# Patient Record
Sex: Male | Born: 1950 | Race: White | Hispanic: No | Marital: Married | Smoking: Current every day smoker
Health system: Southern US, Community
[De-identification: ages and names within clinical notes are randomized; demographics above are authoritative.]

## PROBLEM LIST (undated history)

## (undated) DIAGNOSIS — I509 Heart failure, unspecified: Secondary | ICD-10-CM

## (undated) DIAGNOSIS — E119 Type 2 diabetes mellitus without complications: Secondary | ICD-10-CM

## (undated) DIAGNOSIS — I712 Thoracic aortic aneurysm, without rupture: Secondary | ICD-10-CM

---

## 1997-08-04 ENCOUNTER — Ambulatory Visit (HOSPITAL_COMMUNITY): Admission: RE | Admit: 1997-08-04 | Discharge: 1997-08-04 | Payer: Self-pay | Admitting: *Deleted

## 1997-08-31 ENCOUNTER — Inpatient Hospital Stay (HOSPITAL_COMMUNITY): Admission: RE | Admit: 1997-08-31 | Discharge: 1997-09-01 | Payer: Self-pay | Admitting: *Deleted

## 1998-05-19 ENCOUNTER — Ambulatory Visit (HOSPITAL_COMMUNITY): Admission: RE | Admit: 1998-05-19 | Discharge: 1998-05-19 | Payer: Self-pay | Admitting: *Deleted

## 1998-08-10 ENCOUNTER — Emergency Department (HOSPITAL_COMMUNITY): Admission: EM | Admit: 1998-08-10 | Discharge: 1998-08-10 | Payer: Self-pay | Admitting: Emergency Medicine

## 1998-08-10 ENCOUNTER — Encounter: Payer: Self-pay | Admitting: Emergency Medicine

## 2001-04-16 ENCOUNTER — Ambulatory Visit (HOSPITAL_BASED_OUTPATIENT_CLINIC_OR_DEPARTMENT_OTHER): Admission: RE | Admit: 2001-04-16 | Discharge: 2001-04-16 | Payer: Self-pay | Admitting: Internal Medicine

## 2003-09-25 ENCOUNTER — Emergency Department (HOSPITAL_COMMUNITY): Admission: EM | Admit: 2003-09-25 | Discharge: 2003-09-25 | Payer: Self-pay | Admitting: *Deleted

## 2004-05-02 ENCOUNTER — Ambulatory Visit (HOSPITAL_COMMUNITY): Admission: RE | Admit: 2004-05-02 | Discharge: 2004-05-02 | Payer: Self-pay | Admitting: Neurosurgery

## 2005-01-09 ENCOUNTER — Ambulatory Visit (HOSPITAL_COMMUNITY): Admission: RE | Admit: 2005-01-09 | Discharge: 2005-01-09 | Payer: Self-pay | Admitting: Neurosurgery

## 2005-02-23 ENCOUNTER — Inpatient Hospital Stay (HOSPITAL_COMMUNITY): Admission: RE | Admit: 2005-02-23 | Discharge: 2005-02-28 | Payer: Self-pay | Admitting: Neurosurgery

## 2005-03-02 ENCOUNTER — Ambulatory Visit: Payer: Self-pay | Admitting: Cardiology

## 2005-03-03 ENCOUNTER — Inpatient Hospital Stay (HOSPITAL_COMMUNITY): Admission: EM | Admit: 2005-03-03 | Discharge: 2005-03-06 | Payer: Self-pay | Admitting: Emergency Medicine

## 2005-03-26 ENCOUNTER — Ambulatory Visit: Payer: Self-pay | Admitting: Cardiology

## 2005-04-12 ENCOUNTER — Ambulatory Visit: Payer: Self-pay | Admitting: Cardiology

## 2005-05-07 ENCOUNTER — Ambulatory Visit: Payer: Self-pay | Admitting: Cardiology

## 2005-06-18 ENCOUNTER — Ambulatory Visit: Payer: Self-pay | Admitting: Cardiology

## 2005-06-18 ENCOUNTER — Ambulatory Visit: Payer: Self-pay | Admitting: Internal Medicine

## 2005-06-22 ENCOUNTER — Ambulatory Visit: Payer: Self-pay | Admitting: Cardiology

## 2005-09-04 ENCOUNTER — Ambulatory Visit: Payer: Self-pay | Admitting: *Deleted

## 2006-03-06 IMAGING — CR DG CHEST 2V
2 series · 2 of 2 positions shown · non-contrast
Comparison: 05/02/04

CLINICAL DATA: pre-admit for lumbar fusion
 OBJNN-5 VIEWS:

[view not recorded (1 of 2)]
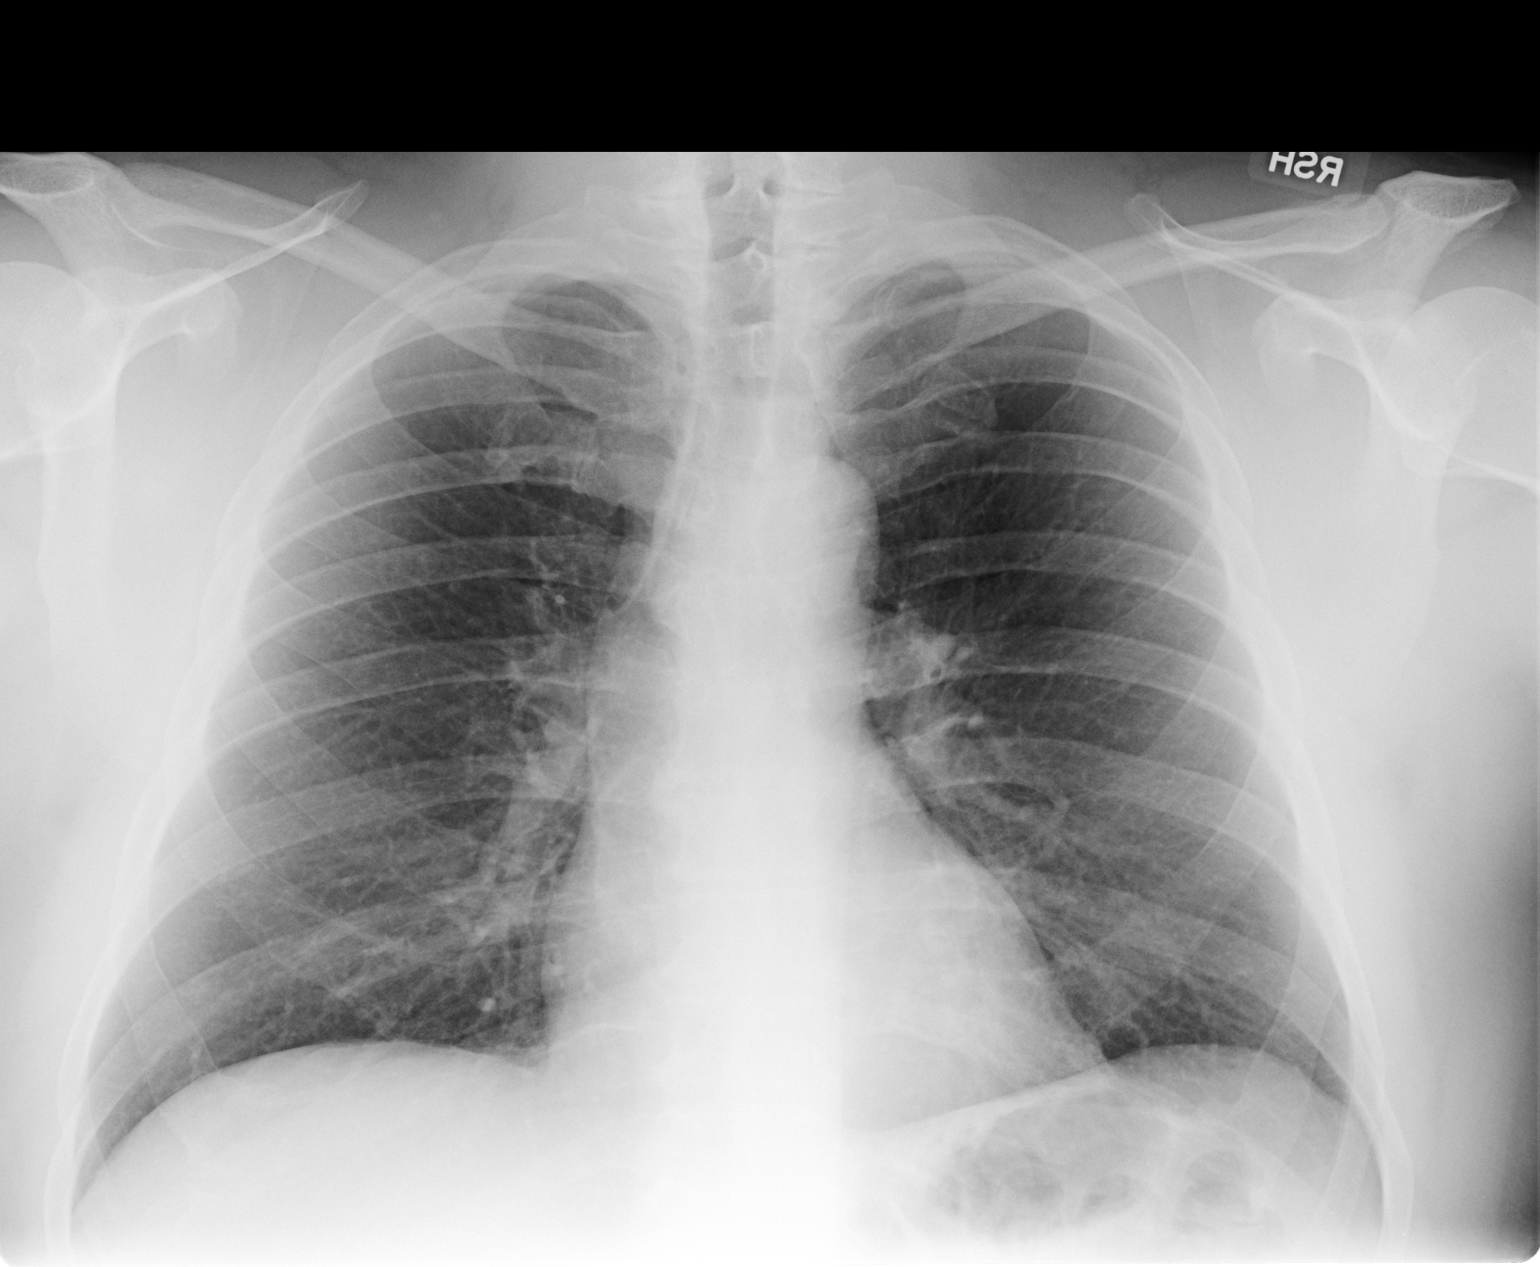

[view not recorded (2 of 2)]
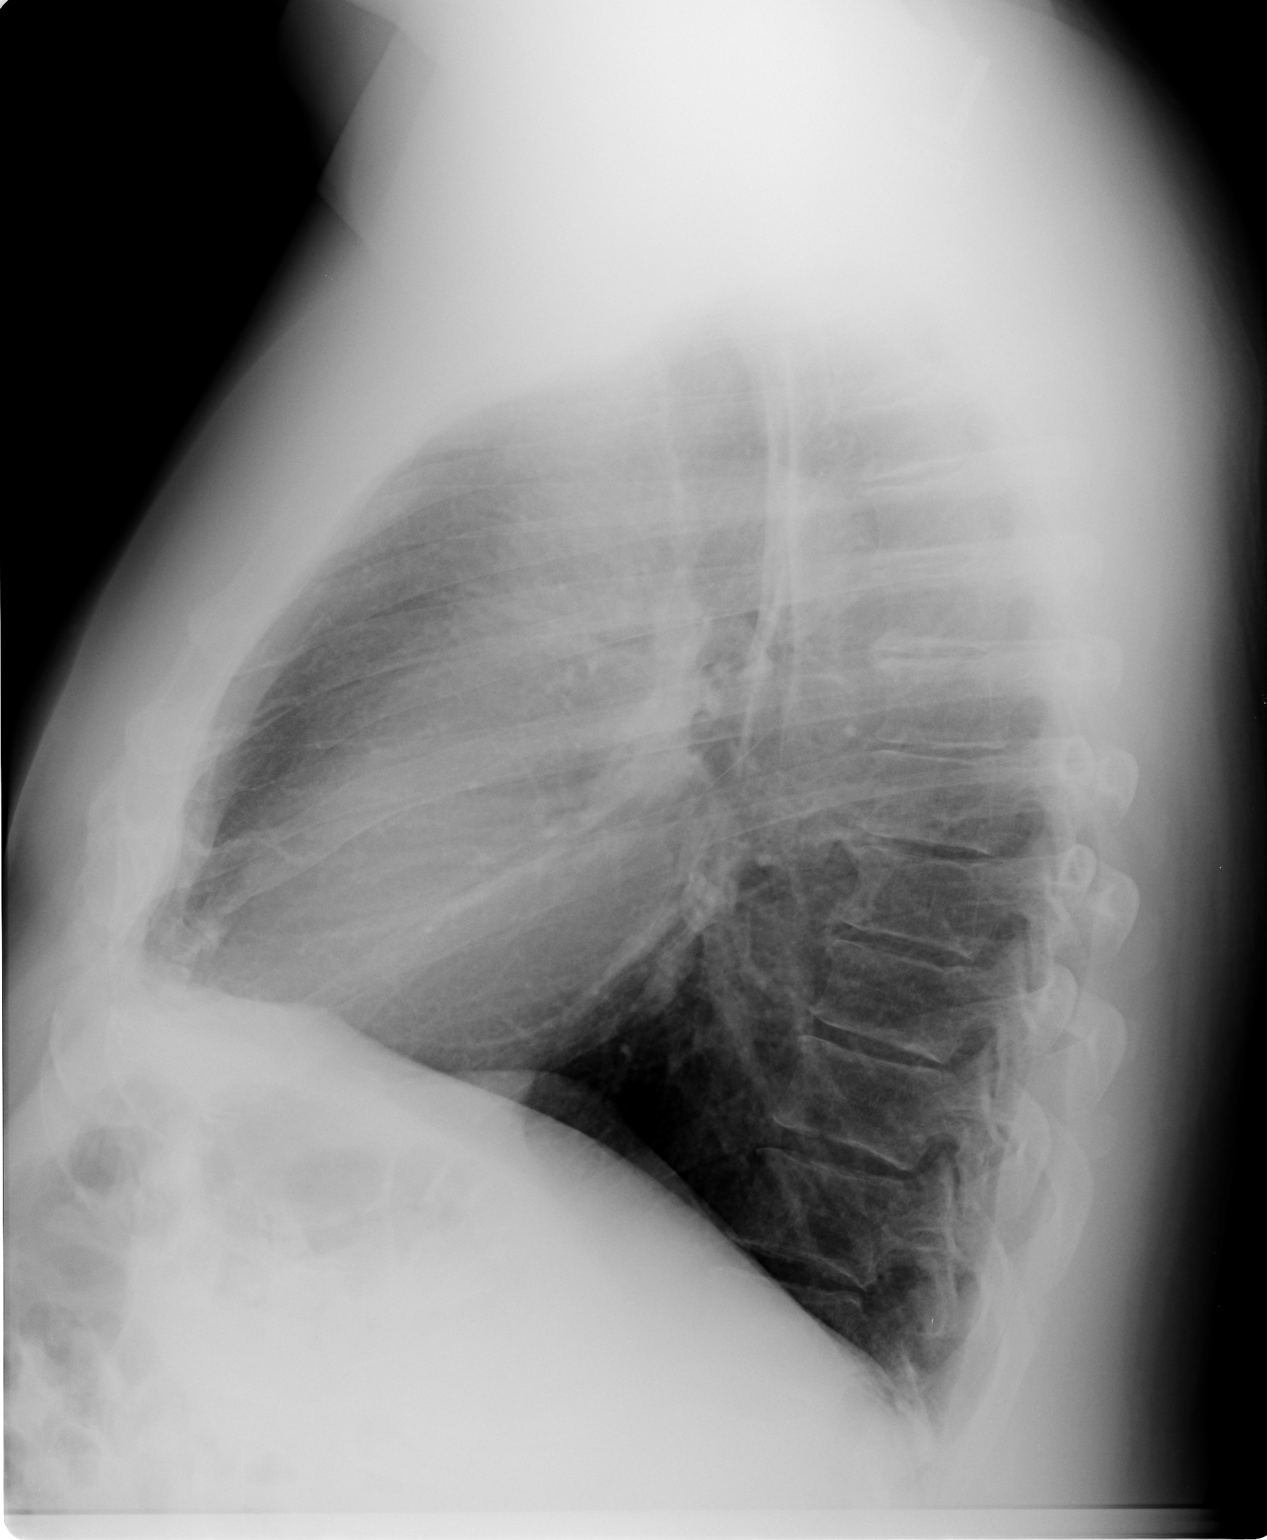

[2 of 2 positions shown; findings below may reference images not displayed]

FINDINGS: Midline trachea.  Heart size normal.  Mediastinal contours unremarkable.  Bronchial wall thickening again identified.  No focal lung opacities.  No pleural effusion.  Osseous structures are intact.
IMPRESSION: No acute cardiopulmonary disease.  Stable mild bronchial wall thickening.

## 2007-07-11 ENCOUNTER — Ambulatory Visit: Payer: Self-pay | Admitting: Cardiology

## 2007-10-10 ENCOUNTER — Ambulatory Visit (HOSPITAL_COMMUNITY): Admission: RE | Admit: 2007-10-10 | Discharge: 2007-10-10 | Payer: Self-pay | Admitting: Neurosurgery

## 2010-07-11 NOTE — Op Note (Signed)
NAMESHOJI, PERTUIT               ACCOUNT NO.:  [id number]   MEDICAL RECORD NO.:  [id number]          PATIENT TYPE:  AMB   LOCATION:  SDS                          FACILITY:  [facility]   PHYSICIAN:  [name], [name]      DATE OF BIRTH:  01/04/1951   DATE OF PROCEDURE:  10/10/2007  DATE OF DISCHARGE:  10/10/2007                               OPERATIVE REPORT   PREOPERATIVE DIAGNOSIS:  Left carpal tunnel syndrome.   POSTOPERATIVE DIAGNOSIS:  Left carpal tunnel syndrome.   OPERATIVE PROCEDURE:  Left carpal tunnel release.   SURGEON:  [name], MD   ANESTHESIA:  General endotracheal.   PREPARATION:  Prepped and draped with alcohol wipe.   COMPLICATIONS:  None.   NURSE ASSISTANT:  Covington.   BODY OF TEXT:  A 60 year old gentleman with left carpal tunnel syndrome,  taken to the operating room, smoothly anesthetized and intubated, placed  prone on the operating table.  Following shave, prep, and drape in the  usual sterile fashion, the skin was infiltrated with 1% lidocaine.  The  skin incision was on the line between the third and fourth raise of the  hand extending a centimeter and half to the distal wrist crease.  Palmar  fat was dissected.  The volar aspect of the transverse carpal ligament  was identified and divided down toward the nerve where it was visible.  Scissors were then used to divide the tranverse carpal ligament up on  the wrist and down under the palm of the hand.  A complete decompression  obtained.  The patient was neurologically intact.  The wound was  irrigated in a single layer of 3-0 nylon and an interrupted vertical  mattress fashion was used to close.  Betadine and Telfa dressing was  applied with a fluffy wrap.  The patient returned to recovery room in  good condition.           ______________________________  [name], [name]     MWR/MEDQ  D:  10/10/2007  T:  10/10/2007  Job:  [id number]

## 2010-07-11 NOTE — Assessment & Plan Note (Signed)
[facility]                            CARDIOLOGY OFFICE NOTE   Scott Riddle, Scott Riddle                      MRN:          [id number]  DATE:07/11/2007                            DOB:          Aug 26, 1950    Scott Riddle returns to follow up with [place] after a two year absence from  the practice.   PAST MEDICAL HISTORY:  1. He had an ST segment elevation MI in January of 2007.  He had a      Taxus stent to the mid right coronary artery and proximal LAD.  He      a residual of 67% in the mid circumflex.  His ejection fraction was      55% by ventriculography.  He was in the TRITON study, but developed      a rash from Plavix.  He has been on Ticlid ever since.  He is being      followed at the [place] where he gets frequent blood work.  His CBCs      have been within normal limits as I have reviewed them today.  2. Severe mixed hyperlipidemia.  He had triglycerides over 1200, but      these have been pushed down to about 350 on a combination of fish      oil, Tricor and Crestor.  3. Hypertension.  4. History of small infrarenal abdominal aortic aneurysm.  5. History of tachy palpitations.   He has been having some stabbing, sharp chest pain that is intermittent.  He has been taking nitroglycerin for this.  He denies any true angina or  ischemic symptoms.  At times at night when he is lying down, he has some  palpitations.  They resolve as soon as he sits up.  He has had no  syncope or presyncope.   CURRENT MEDICATIONS:  1. Aspirin 325 mg daily.  2. Lyrica 75 mg p.o. b.i.d.  3. Wellbutrin 300 mg daily.  4. Tricor 145 mg nightly.  5. Rosuvastatin 10 mg daily.  6. Niacin 500 mg twice daily.  7. Lisinopril 10 mg daily.  8. Metoprolol 25 b.i.d.  9. Ticlid 250 mg p.o. b.i.d.  10.C Omegas 3 tablets t.i.d.  11.Nicotinell 10 mg b.i.d.  12.Morphine 30 mg, 1 in the morning, 2 in the evening.  13.Temazepam 30 mg daily.   He was admitted to the [facility]  with chest pain last year.  He  had a stress nuclear study, which showed overall normal left ventricular  function, no specific EF given.  He had no obvious ischemia.   EXAMINATION:  His blood pressure is 120/80, pulse 59 and regular.  His  weight is 283.  He walks with a cane because of severe back problems  with left lower extremity atrophy.  He is very pleasant.  Alert and  oriented x3.  HEENT:  Sclerae slightly injected.  He is bearded.  Otherwise negative.  Carotid upstrokes are equal bilaterally without bruits.  No JVD.  Thyroid is not enlarged.  Trachea is midline.  LUNGS:  Clear.  HEART:  Reveals a  nondisplaced PMI.  He has a normal S1, S2.  ABDOMEN:  Soft with good bowel sounds.  EXTREMITIES: Reveal severe atrophy of the left lower extremity.  Pulses  are intact.  There is no sign of DVT.  NEURO:  Exam is grossly intact except for his gait.   His EKG is essentially normal except for some sinus bradycardia.   I have had a long talk with Brit and his wife today.  I have  recommended that he come off the Ticlid since there is really no data  this far out from drug-eluting stents.  It is also a potentially toxic  drug in terms of bleeding and also bone marrow suppression.   In addition, I have strongly recommended that he get an ultrasound of  his abdomen to follow up on his abdominal aortic aneurysm.  He would  like to get this through the [place].  He told me he will mention this to  them on his next visit.   I have made no changes in his medical program.  I think he is on a good  program otherwise.  We will plan on seeing him back in a year or p.r.n.     Thomas C. Daleen Squibb, MD, [facility]  Electronically Signed    TCW/MedQ  DD: 07/11/2007  DT: 07/11/2007  Job #: [id number]   cc:   [facility]

## 2010-07-14 NOTE — H&P (Signed)
Scott Riddle, Scott Riddle NO.:  [id number]   MEDICAL RECORD NO.:  [id number]          PATIENT TYPE:  INP   LOCATION:  2005                         FACILITY:  [facility]   PHYSICIAN:  Jesse Sans. Riddle, [name]   DATE OF BIRTH:  04/14/1950   DATE OF ADMISSION:  03/02/2005  DATE OF DISCHARGE:                                HISTORY & PHYSICAL   CHIEF COMPLAINT:  Chest pressure with striations across my shoulders and to  both arms with exertion.   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 Trey Sailors.   He has been up and about and doing well up until Thursday, March 01, 2005,  when while up on his crutches began to have this upper chest discomfort  going into both arms. It lasted for about an hour. He said he thought he  would die.   This recurred again yesterday and lasted for about half an hour.   He has no previous cardiac history. He does have risk factors of age, sex,  history of hyperlipidemia that is not treated, obstructive sleep apnea and  obesity. He also smoked heavily for a number of years but quit about a year  ago.   PAST MEDICAL HISTORY:  HE IS INTOLERANT OF NEURONTIN.   CURRENT MEDICATIONS:  1.  Wellbutrin 300 mg daily.  2.  Percocet 2-3 q.6h p.r.n. pain.  3.  Lyrica 75 mg p.o. b.i.d.  4.  Ciprofloxacin 500 mg p.o. b.i.d. x3 more days postoperatively.  5.  Aspirin daily.   OTHER HISTORY:  Is unremarkable other than his chronic back problems with  atrophy of his left lower extremity.   SOCIAL HISTORY:  He is married and lives in [place]. He has two  children, both teenagers. He works here at [employer].   FAMILY HISTORY:  Unremarkable for premature coronary disease.   REVIEW OF SYSTEMS:  Negative other than the HPI. There is no bleeding  history.   PHYSICAL EXAMINATION:  GENERAL:  Tonight he is in no acute distress.  He is  very pleasant.  VITAL SIGNS:  Blood pressure is 115/66, pulse is 72 and he is in sinus  rhythm. Respiratory rate is 18. He is afebrile. His percent saturation is  96%.  HEENT:  He is bearded. Dentition is fair. Extraocular movements are intact.  Pupils are equal, round and reactive to light, sclerae clear. Facial  symmetry is normal. Carotid upstrokes are equal bilaterally without bruits.  There is no JVD. Thyroid is not enlarged.  LUNGS:  Clear. He has a large plastic brace around his chest and back from  his surgery.  ABDOMEN:  Exam is limited but bowel sounds are present and it is  nondistended.  EXTREMITIES:  Reveal no clubbing, cyanosis or edema. Pulses are brisk.  NEURO EXAM:  Intact except for atrophy in his left leg and weakness.   Chest x-ray shows no acute cardiopulmonary disease. Chest CT was obtained  which  showed left lower lobe atelectasis but no PE.   EKG shows ST segment depression of a scooping type in 2, 3 and aVF. This  is new since December several years ago.   His cardiac point of care markers are positive at 0.35 and 0.37. CK-MB is  negative thus far. His other laboratory data is unremarkable. Other lab data  is remarkable for a hemoglobin of 9.9.   ASSESSMENT AND PLAN:  1.  Symptoms consistent with exertional angina with prolonged pain on      Thursday and Friday. He has significant risk factors for cardiac disease      and ST segment changes on his EKG with positive troponin's. Will assume      this is a non ST segment elevation myocardial infarction.  2.  Obstructive sleep apnea.  3.  Hyperlipidemia untreated.  4.  Obesity.  5.  History of recent lumbar fusion a week ago with Dr. Trey Sailors.  6.  History of heavy tobacco use which he quit last year.   PLAN:  1.  Admit to telemetry.  2.  Check serial markers.  3.  Intravenous nitroglycerin.  4.  I have a call in the Dr. Newell Coral who is covering for Dr. Channing Mutters to see if      we can use IV heparin.   5.  Continue other medications.  6.  Check fasting lipids and begin Statin at appropriate dose.  7.  Cardiac catheterization on Monday.   Indications of risks and potential benefits have been discussed with the  patient and his wife and they agree to proceed.      Scott Riddle, [name]  Electronically Signed     TCW/MEDQ  D:  03/03/2005  T:  03/03/2005  Job:  [id number]   cc:   [name], [name]  Fax: [phone]   [name]  Fax: [phone]

## 2010-07-14 NOTE — Cardiovascular Report (Signed)
[name], [name] NO.:  [id number]   MEDICAL RECORD NO.:  [id number]          PATIENT TYPE:  INP   LOCATION:  6523                         FACILITY:  [facility]   PHYSICIAN:  Charlies Constable, [name] [facility] DATE OF BIRTH:  May 29, 1950   DATE OF PROCEDURE:  03/05/2005  DATE OF DISCHARGE:                              CARDIAC CATHETERIZATION   CLINICAL HISTORY:  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  He had back surgery about seven to 10 days  ago with two-level fusion.  Following discharge he developed chest pain, was  admitted to the hospital with more severe chest pain and positive troponins  consistent with a non-ST elevation infarction.  Dr. Eden Emms studied him  earlier today and found tight lesions in the mid right and proximal LAD and  moderate lesion in the circumflex.  We elected to treat the LAD and right  lesions today.   PROCEDURE:  The procedure was performed via the right femoral artery.  We  used a 6-French JR4 guiding catheter with side holes for the right coronary  intervention.  Patient was given Angiomax bolus and infusion.  He was  enrolled in the TRITAN drug and was randomized either to prasugrel versus  Ticlid.  We passed a Prowater wire across the lesion in the mid right  coronary artery into the distal vessel without difficulty.  We pre dilated  with a 3.25 x 50 mm Maverick performing one inflations up to 8 atmospheres  for 30 seconds.  We then deployed a 3.5 x 20 mm TAXUS stent deploying this  with one inflation up to 14 atmospheres for 30 seconds.  We post dilated  with a 3.75 x 15 mm Quantum Maverick performing one inflation up to 16  atmospheres for 30 seconds.  Final diagnostic studies were then performed  through the guiding catheter.   We then preformed then approached the left anterior descending artery  lesion.  This was located in the proximal LAD but there was some disease  that extended  across the beyond a diagonal branch.  The ostium of the  diagonal branch was free of major obstruction.  We passed a Prowater wire  across the lesion into the distal LAD without difficulty.  We pre dilated  with a 2.5 x 15 mm Maverick performing one inflation up to 6 atmospheres for  30 seconds.  We then deployed a 2.75 x 20 mm TAXUS stent crossing the  diagonal branch.  We deployed this with one inflation up to 12 atmospheres  for 30 seconds.  We post dilated with a 3 x 15 mm Quantum Maverick  performing two inflations up to 15 atmospheres for 30 seconds.  Final  diagnostic studies were then performed through the guiding catheter.  Patient tolerated procedure well and left the laboratory in satisfactory  condition.   RESULTS:  Initially the stenosis in the mid right coronary artery was  estimated at 95%.  Following stenting this improved to 0%.   The lesion in the left anterior descending artery was  initially estimated at  95%.  Following stenting this improved to 0%.   CONCLUSION:  1.  Successful PCI of the lesion in the mid right coronary artery using a      TAXUS drug-eluting stent with improvement in the center of narrowing      from 95% to 0%.  2.  Successful PCI of the lesion in the proximal left anterior descending      using a TAXUS drug-eluting stent with improvement in the center of      narrowing from 95% to 0%.   DISPOSITION:  Patient returned to postanesthesia care unit for further  observation.  Will plan evaluation of the circumflex lesion at a later time  probably with an exercise Myoview scan to decide if any further  revascularization is needed.           ______________________________  Charlies Constable, [name] [facility]     BB/MEDQ  D:  03/05/2005  T:  03/05/2005  Job:  [id number]   cc:   [name]  Fax: [phone]   [name], [name]  Fax: [phone]   [name], [name]  [address]  [address]  [place]  [place] [address]   [name], [name]  [address]  [address]  [place]  [place] [address]   [facility]

## 2010-07-14 NOTE — Op Note (Signed)
NAMEGUALBERTO, WAHLEN NO.:  [id number]   MEDICAL RECORD NO.:  [id number]          PATIENT TYPE:  OIB   LOCATION:  2899                         FACILITY:  [facility]   PHYSICIAN:  [name], [name]      DATE OF BIRTH:  09-07-1950   DATE OF PROCEDURE:  05/02/2004  DATE OF DISCHARGE:                                 OPERATIVE REPORT   PREOPERATIVE DIAGNOSIS:  Right carpal tunnel syndrome.   POSTOPERATIVE DIAGNOSIS:  Right carpal tunnel syndrome.   OPERATION PERFORMED:  Right carpal tunnel release.   SURGEON:  [name], [name]   ANESTHESIA:  1% local lidocaine with MAC.   COMPLICATIONS:  None.   ASSISTANT:  Covington.   DESCRIPTION OF PROCEDURE:  60 year old gentleman with right carpal tunnel  syndrome.  Taken to the operating room, smoothly anesthetized and intubated,  placed prone on the operating table.  Following shave, prep in the usual  sterile fashion, 1% local lidocaine was injected along the line between the  third and fourth rays 1 cm distal to the distal wrist crease.  Palm was  incised down to the palmar fat.  The transverse carpal ligament was  identified and opened. The median nerve was immediately identified, the  carpal tunnel explored and found to be open.  Scissors were used to divide  the ligament in its volar portion up to its most proximal extent and then to  its most distal extent.  The nerve was completely freed.  Hand function was  normal.  The wound was irrigated and hemostasis was assured.  Closed with a  single layer of 3-0 nylon in an interrupted vertical mattress fashion.  Betadine Telfa dressing and standard hand wrap was applied.      MWR/MEDQ  D:  05/02/2004  T:  05/02/2004  Job:  [id number]

## 2010-07-14 NOTE — H&P (Signed)
Scott Riddle, Scott Riddle NO.:  [id number]   MEDICAL RECORD NO.:  [id number]          PATIENT TYPE:  INP   LOCATION:  2860                         FACILITY:  [facility]   PHYSICIAN:  [name], [name]      DATE OF BIRTH:  11/13/1950   DATE OF ADMISSION:  02/23/2005  DATE OF DISCHARGE:                                HISTORY & PHYSICAL   ADMITTING DIAGNOSES:  Spondylosis L4-5 and L5-S1.   This is a 60 year old right-handed white gentleman who I had known for  numerous years.  He is a former patient of Dr. Danella Penton.  Has had pain in  his back, down his lower extremities.  He had diskectomy in 1981.  This was  at 4-5 and at 5-1.  He had been in and out of the pain clinic, off and on  methadone.  He is currently off, but has pain in his back, atrophy of the  left calf, and a dorsiflexor weakness 4/5 on the left and extensive  compression at 4-5 and 5-1.  Is admitted now for decompression and fusion at  those levels.   PAST MEDICAL HISTORY:  Depression.  He has been on Wellbutrin for that.  He  has no allergies.  Does smoke a pack of cigarettes a day.  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   FAMILY HISTORY:  Mom is 17, in good health.  Daddy is 75, in reasonably good  health.   REVIEW OF SYSTEMS:  Remarkable for back and leg pain.   PHYSICAL EXAMINATION:  HEENT:  Within normal limits.  He has reasonable  range of motion of neck.  CHEST:  Clear.  CARDIAC:  Regular rate and rhythm.  ABDOMEN:  Nontender with no hepatosplenomegaly.  EXTREMITIES:  Without clubbing, cyanosis, edema.  GENITOURINARY:  Deferred.  PERIPHERAL PULSES:  Good.  NEUROLOGIC:  He is awake, alert, and oriented.  Cranial nerves are intact.  Motor examination shows 5/5 strength throughout the upper extremity and  lower extremities.  In the left leg he has atrophy of the left calf.  Dorsiflexor weakness 4/5 on the left.  In the knee extensors and hip flexors  he had severe pain limitation.   MR demonstrates  spondylosis at 4-5 and 5-1.  On the left at 5-1 he has  compression at 5 root laterally as well as where it traverses disk space.  At 4-5 he has severe spondylytic change.   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   PLAN:  Lumbar laminectomy, diskectomy, posterior lumbar interbody fusion,  interbody fusion cages and pedicle screw fixation L4-5 and L5-S1.  The risks  and benefits of this approach have been discussed with him and he wished to  proceed.           ______________________________  [name], [name]     MWR/MEDQ  D:  02/23/2005  T:  02/23/2005  Job:  [id number]

## 2010-07-14 NOTE — Discharge Summary (Signed)
NAMENona Dell NO.:  [id number]   MEDICAL RECORD NO.:  [id number]            PATIENT TYPE:   LOCATION:                                 FACILITY:   PHYSICIAN:  [name], [name]           DATE OF BIRTH:   DATE OF ADMISSION:  02/23/2005  DATE OF DISCHARGE:  02/28/2005                                 DISCHARGE SUMMARY   ADMISSION DIAGNOSIS:  Spondylosis, L5-S1.   DISCHARGE DIAGNOSES:  Spondylosis, L5-S1.   PROCEDURE:  L5-S1 laminectomy and diskectomy.  __________ cages.  Segmental  _________ and screw fixation from L4 to S1.  Posterolateral arthrodesis L4-  S1.   COMPLICATIONS:  None.  __________.   HISTORY OF PRESENT ILLNESS:  A 60 year old gentleman with a history and  physical is recanted on the chart.  He has had diskectomies at L4-5 and 5-1  numerous years ago.  He has developed pain and atrophy in the left leg.   PHYSICAL EXAMINATION:  GENERAL:  Intact.  NEUROLOGIC:  Exam showed left L4-5 radiculopathy.   HOSPITAL COURSE:  He was admitted after ascertaining normal lab chart  values. He underwent fusion at 4-5 and 5-1.  Postoperatively he has done  reasonably well.  His left pain is alleviated.  He has intermittent pain in  his right leg.  Strength is full save for the dorsiflexors on the left which  are at baseline weak.  Had some drainage from the incision which is now dry.   DISCHARGE MEDICATIONS:  He is being discharged home on Lyrica, Percocet,  ciprofloxacin and Wellbutrin.   FOLLOW UP:  His followup will be [facility] office in  about one week for suture removal.           ______________________________  [name], [name]     MWR/MEDQ  D:  02/28/2005  T:  02/28/2005  Job:  [id number]

## 2010-07-14 NOTE — Op Note (Signed)
[name], Riddle NO.:  [id number]   MEDICAL RECORD NO.:  [id number]          PATIENT TYPE:  INP   LOCATION:  3005                         FACILITY[facility]  [facility]   PHYSICIAN:  [name], [name]      DATE OF BIRTH:  [birth year]   DATE OF PROCEDURE:  02/23/2005  DATE OF DISCHARGE:                                 OPERATIVE REPORT   PREOPERATIVE DIAGNOSIS:  Spondylosis, L4-5, L5-S1.   POSTOPERATIVE DIAGNOSIS:  Spondylosis, L4-5, L5-S1.   PROCEDURE:  L4-5 and L5-S1 laminectomy, diskectomy, posterior lumbar  interbody fusion with Ray threaded fusion cages, segment of pedicle screw  fixation from L4 to S1, and posterolateral arthrodesis from L4 to S1.   SURGEON:  [name], MD.   ANESTHESIA:  General endotracheal.   PREP:  Prepped with Betadine and prepped and scrubbed with alcohol wipe.   COMPLICATIONS:  None.   NURSE ASSISTANT:  Covington.   PHYSICIAN ASSISTANT:  Clydene Fake, MD.   BODY OF TEXT:  This is a 60 year old gentleman with severe lumbar  spondylosis, atrophy of the left leg.  Taken to the operating room, smoothly  anesthetized and intubated, placed prone on the operating table.  Following  shave, prep, and drape in the usual sterile fashion, the skin was  infiltrated with 1% Lidocaine with 1:400,000 epinephrine.  This skin was  incised from mid L3 to mid S1.  The lamina of L4 and L5, S1, and the sacral  ala and the transverse processes of L4 and L5 were exposed bilaterally on a  subperiosteal plane.  Intraoperative x-ray confirmed correctness level had  been confirmed correctness level.  The pars interarticularis, remaining  lamina, and inferior facet of L4 and L5, the superior facet of L5 and S1  were removed bilaterally using the high-speed drill.  On the left side,  there was severe facet arthropathy and significant compression of both the 4  and 5 roots as they traversed the foraminal areas.  This was completely  relieved by removing the  facet joints and removing the ligamentum flavum.  The right side presented with relatively normal anatomy.  Following complete  decompression of the 4, 5, and 1 roots, it was decided not to place Ray  cages on the left side because of the dense scarring and the concern with  injuring the root.  Cages were then placed on the right side, 12 x 26 mm.  They were packed with bone graft harvested from the facet joints.  Pedicle  screws were then placed at L4, L5, and S1 bilaterally, and intraoperative x-  ray showed good placement of screws.  The screws were connected by a rod,  and the locking caps tightened.  The transverse process and the sacral ala  were decorticated with a high-speed drill, and BMP on the extender matrix  was placed over them.  Intraoperative films showed good placement of cages,  screws, and rods.  The subcutaneous tissue and fascia were reapproximated  with 2-0 Vicryl in an interrupted fashion, the subcuticular tissues were  reapproximated with 3-0 Vicryl in an interrupted  fashion, and the skin was  closed with 3-0 nylon in a running lock fashion.  A Betadine and Telfa  dressing was applied and made occlusive with OpSite, and the patient  returned to the recovery room in good condition.           ______________________________  [name], [name]     MWR/MEDQ  D:  02/23/2005  T:  02/25/2005  Job:  [id number]

## 2010-07-14 NOTE — Cardiovascular Report (Signed)
NAMEDRESHAWN, HENDERSHOTT NO.:  [id number]   MEDICAL RECORD NO.:  [id number]          PATIENT TYPE:  INP   LOCATION:  2399                         FACILITY:  [facility]   PHYSICIAN:  [name], [name]     DATE OF BIRTH:  [birth year]   DATE OF PROCEDURE:  03/05/2005  DATE OF DISCHARGE:                              CARDIAC CATHETERIZATION   PROCEDURE:  Catheterization.   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  He was having  classic angina and bumped his troponins.  Catheterization was done to rule  out coronary disease.  Note should be made that the patient had lumbar back  surgery by Dr. Channing Mutters about 10 days ago.  I spoke with Dr. Channing Mutters and he said that  using full-dose anticoagulants was fine.   Cine catheterization was done from right femoral artery with a 6-French  catheter.   The left main coronary was normal.   The left anterior descending artery had 80% disease just before the takeoff  of the second diagonal branch.  The distal vessel was normal.   The circumflex coronary was nondominant.  There was long, tubular 60-70%  midvessel disease.  This included the takeoff of the first obtuse marginal  branch.   Right coronary artery appeared to be the culprit vessel.  There are 90%  multiple lesions that were eccentric and with possible thrombus in the  midvessel.  Flow was normal.  PDA and PLA were normal.   RAO ventriculography:  RAO ventriculography showed minimal inferior basal  wall hypokinesis.  EF was preserved to 55%.  There is no gradient across  aortic valve and no MR.  Aortic pressure was 130/15, LV pressure was 130/15.  Aortic pressure was 130/84.   The patient had a small infrarenal AAA by distal aortography.   IMPRESSION:  Films will be reviewed with Dr. Juanda Chance.  I suspect he will want  to do two-vessel angioplasty.  However, the patient would be a candidate for  bypass surgery.   I believe the right coronary artery is probably  the culprit lesion.           ______________________________  [name], [name]     PN/MEDQ  D:  03/05/2005  T:  03/05/2005  Job:  [id number]   cc:   [name], [name]  [address]  [address]  [place]  [place] [address]

## 2010-07-14 NOTE — Discharge Summary (Signed)
NAMERAEL, YO NO.:  [id number]   MEDICAL RECORD NO.:  [id number]          PATIENT TYPE:  INP   LOCATION:  6523                         FACILITY:  [facility]   PHYSICIAN:  Charlies Constable, [name] [facility] DATE OF BIRTH:  25-Aug-1950   DATE OF ADMISSION:  03/02/2005  DATE OF DISCHARGE:  03/06/2005                                 DISCHARGE SUMMARY   PRIMARY CARDIOLOGIST:  Maisie Fus C. Wall, [name] (new).   PRINCIPAL DIAGNOSES:  1.  Non-ST elevation myocardial infarction.  2.  Three-vessel coronary artery disease.      1.  Status post TAXUS stenting 95% right coronary artery, 95% left          anterior descending artery, March 05, 2005 (TRITON study).      2.  Residual 60-70% circumflex artery disease.      3.  Preserved left ventricular fraction (EF 55%).  3.  Mixed dyslipidemia.  4.  Normocytic anemia.      1.  Status post one unit red blood cell transfusion.  5.  Small infrarenal abdominal aortic aneurysm.  6.  Status post recent lumbar fusion.   SECONDARY DIAGNOSES:  1.  Obstructive sleep apnea.  2.  History of tobacco.   PROCEDURES:  1.  Coronary angiogram/TAXUS stenting, 95% mid right coronary artery and 95%      mid left anterior descending artery by Dr. Charlies Constable, March 05, 2005.  2.  Status post lidocaine/epinephrine injection, March 06, 2005.   REASON FOR ADMISSION:  Mr. Pasternak is a 60 year old male with no prior  cardiac history who is status post recent lumbar fusion by Dr. Trey Sailors and  who presented to Dr. Valera Castle in the emergency room with new onset chest  pain and associated abnormal EKG findings and elevated cardiac markers.   HOSPITAL COURSE:  Following admission, serial cardiac markers were abnormal  and consistent with non-ST elevation myocardial infarction with a peak  troponin of 0.56 on admission.  Following clearance by Dr. Trey Sailors, the  patient was placed on intravenous heparin.  He was also transfused with one  unit of packed  RBCs, per Dr. Eden Emms, for treatment of anemia with a  presenting hemoglobin level of 9.9.  A followup CBC showed stabilization of  the anemia.  Stools for guaiac were pending.   On hospital day #2, the patient underwent diagnostic coronary angiography,  by Dr. [name], revealing significant two-vessel coronary artery  disease and preserved left ventricular function (see report for full  details).  Additionally, he found evidence of a small infrarenal abdominal  aortic aneurysm which he noted had been found by recent MRI.  The patient  was then referred to Dr. Charlies Constable who proceeded with successful TAXUS  stenting of high grades lesions in both the RCA and LAD with no noted  complications.  The patient was enrolled in the TRITON study.  The patient  was kept for overnight observation, cleared for discharge the following  morning in a hemodynamically stable condition.  He did have persistent  oozing of the right  groin which was treated with lidocaine/epinephrine  injection prior to discharge.   At the time of discharge, Dr. Juanda Chance recommended substitution of high dose  Lipitor with Vytorin to try to potentiate the low HDL (28).  The patient may  need the addition of either TriCor or Niaspan in the future for treatment of  elevated triglycerides.   DISCHARGE LABORATORY:  Hemoglobin 11.2, hematocrit 32, WBC 6.6, platelets  296.  Sodium 139, potassium 4.1, glucose 108, BUN 16, creatinine 1.2.  CPK  95/3.1.  Troponin I of 0.27.  Outstanding labs:  Lipid profile:  Total  cholesterol 234, triglycerides 472, HDL 28.  Electrolytes and renal function  remain normal.  Normal liver enzymes.   DISCHARGE MEDICATIONS:  1.  TRITON research study drug as directed.  2.  Coated aspirin 325 mg every day.  3.  Toprol XL 75 mg every day.  4.  Vytorin 10/20 mg every day.  5.  Lyrica 75 mg b.i.d.  6.  Wellbutrin 300 mg every day.  7.  Percocet 1-2 tablets as directed.  8.  Nitrostat 0.4 mg as  directed.   INSTRUCTIONS:  1.  The patient is scheduled to follow up with Dr. Valera Castle on March 26, 2005 at 9:45 a.m.  2.  The patient is instructed to arrange followup with Dr. Trey Sailors as      instructed.   Discharge duration time:  44 minutes.      Gene Serpe, P.A. [facility]    ______________________________  Charlies Constable, [name] [facility]    GS/MEDQ  D:  03/06/2005  T:  03/06/2005  Job:  [id number]   cc:   [name], [name]  Fax: [phone]   [name]  Fax: [phone]

## 2010-08-29 ENCOUNTER — Inpatient Hospital Stay: Payer: Self-pay | Admitting: Internal Medicine

## 2010-08-30 DIAGNOSIS — R079 Chest pain, unspecified: Secondary | ICD-10-CM

## 2010-08-30 DIAGNOSIS — I517 Cardiomegaly: Secondary | ICD-10-CM

## 2011-06-29 ENCOUNTER — Inpatient Hospital Stay: Payer: Self-pay | Admitting: Internal Medicine

## 2011-06-29 LAB — COMPREHENSIVE METABOLIC PANEL
Alkaline Phosphatase: 79 U/L (ref 50–136)
Anion Gap: 9 (ref 7–16)
Bilirubin,Total: 0.5 mg/dL (ref 0.2–1.0)
Co2: 26 mmol/L (ref 21–32)
Creatinine: 1.04 mg/dL (ref 0.60–1.30)
EGFR (Non-African Amer.): >60
Potassium: 4 mmol/L (ref 3.5–5.1)
SGOT(AST): 33 U/L (ref 15–37)
Sodium: 137 mmol/L (ref 136–145)

## 2011-06-29 LAB — URINALYSIS, COMPLETE
Bacteria: NONE SEEN
Bilirubin,UR: NEGATIVE
Ph: 5 (ref 4.5–8.0)
Protein: NEGATIVE
Specific Gravity: 1.009 (ref 1.003–1.030)

## 2011-06-29 LAB — DRUG SCREEN, URINE
Barbiturates, Ur Screen: NEGATIVE (ref ?–200)
Methadone, Ur Screen: NEGATIVE (ref ?–300)
Opiate, Ur Screen: POSITIVE (ref ?–300)
Tricyclic, Ur Screen: NEGATIVE (ref ?–1000)

## 2011-06-29 LAB — ACETAMINOPHEN LEVEL: Acetaminophen: <2 ug/mL

## 2011-06-29 LAB — CBC
MCH: 28.8 pg (ref 26.0–34.0)
Platelet: 132 10*3/uL — ABNORMAL LOW (ref 150–440)
WBC: 5.3 10*3/uL (ref 3.8–10.6)

## 2011-06-29 LAB — TSH: Thyroid Stimulating Horm: 1.18 u[IU]/mL

## 2011-06-29 LAB — ETHANOL: Ethanol: <3 mg/dL

## 2011-06-30 LAB — URINALYSIS, COMPLETE
Glucose,UR: NEGATIVE mg/dL (ref 0–75)
Ketone: NEGATIVE
Ph: 8 (ref 4.5–8.0)
Specific Gravity: 1.008 (ref 1.003–1.030)

## 2011-06-30 LAB — CBC WITH DIFFERENTIAL/PLATELET
Basophil #: 0.1 10*3/uL (ref 0.0–0.1)
Eosinophil %: 13 %
Lymphocyte %: 17.9 %
Monocyte #: 0.6 x10 3/mm (ref 0.2–1.0)
Monocyte %: 6.3 %
Neutrophil #: 6 10*3/uL (ref 1.4–6.5)
Neutrophil %: 61.9 %
Platelet: 136 10*3/uL — ABNORMAL LOW (ref 150–440)
RDW: 15 % — ABNORMAL HIGH (ref 11.5–14.5)

## 2011-06-30 LAB — BASIC METABOLIC PANEL
Anion Gap: 8 (ref 7–16)
Co2: 25 mmol/L (ref 21–32)
Creatinine: 0.92 mg/dL (ref 0.60–1.30)
EGFR (African American): >60
Potassium: 4.2 mmol/L (ref 3.5–5.1)

## 2011-07-05 LAB — CULTURE, BLOOD (SINGLE)

## 2011-11-11 ENCOUNTER — Emergency Department: Payer: Self-pay | Admitting: Emergency Medicine

## 2011-11-11 LAB — COMPREHENSIVE METABOLIC PANEL
Anion Gap: 9 (ref 7–16)
Bilirubin,Total: 0.9 mg/dL (ref 0.2–1.0)
Chloride: 102 mmol/L (ref 98–107)
Co2: 28 mmol/L (ref 21–32)
EGFR (African American): >60
EGFR (Non-African Amer.): >60
SGOT(AST): 26 U/L (ref 15–37)
SGPT (ALT): 18 U/L (ref 12–78)

## 2011-11-11 LAB — CBC
HGB: 13.6 g/dL (ref 13.0–18.0)
MCHC: 34.7 g/dL (ref 32.0–36.0)
Platelet: 135 10*3/uL — ABNORMAL LOW (ref 150–440)
RDW: 15.4 % — ABNORMAL HIGH (ref 11.5–14.5)

## 2011-11-11 LAB — APTT: Activated PTT: 30.5 secs (ref 23.6–35.9)

## 2011-11-11 LAB — TROPONIN I: Troponin-I: 0.03 ng/mL

## 2012-01-31 ENCOUNTER — Emergency Department: Payer: Self-pay | Admitting: Emergency Medicine

## 2012-01-31 LAB — COMPREHENSIVE METABOLIC PANEL
Anion Gap: 8 (ref 7–16)
BUN: 11 mg/dL (ref 7–18)
Bilirubin,Total: 0.8 mg/dL (ref 0.2–1.0)
Chloride: 103 mmol/L (ref 98–107)
Co2: 26 mmol/L (ref 21–32)
Creatinine: 0.81 mg/dL (ref 0.60–1.30)
EGFR (Non-African Amer.): >60
Osmolality: 275 (ref 275–301)
Potassium: 3.6 mmol/L (ref 3.5–5.1)
Sodium: 137 mmol/L (ref 136–145)
Total Protein: 7.3 g/dL (ref 6.4–8.2)

## 2012-01-31 LAB — URINALYSIS, COMPLETE
Bilirubin,UR: NEGATIVE
Blood: NEGATIVE
Ketone: NEGATIVE
Leukocyte Esterase: NEGATIVE
Ph: 8 (ref 4.5–8.0)
Protein: NEGATIVE
RBC,UR: 2 /HPF (ref 0–5)
Squamous Epithelial: NONE SEEN

## 2012-01-31 LAB — TROPONIN I: Troponin-I: 0.02 ng/mL

## 2012-01-31 LAB — CK TOTAL AND CKMB (NOT AT ARMC): CK-MB: 1.3 ng/mL (ref 0.5–3.6)

## 2012-01-31 LAB — CBC WITH DIFFERENTIAL/PLATELET
Basophil %: 0.9 %
Eosinophil #: 0.2 10*3/uL (ref 0.0–0.7)
HCT: 33.8 % — ABNORMAL LOW (ref 40.0–52.0)
HGB: 11.3 g/dL — ABNORMAL LOW (ref 13.0–18.0)
MCH: 26.9 pg (ref 26.0–34.0)
MCHC: 33.4 g/dL (ref 32.0–36.0)
Monocyte #: 0.5 x10 3/mm (ref 0.2–1.0)
RDW: 17.2 % — ABNORMAL HIGH (ref 11.5–14.5)

## 2012-01-31 LAB — PROTIME-INR: INR: 1

## 2014-06-20 NOTE — H&P (Signed)
PATIENT NAME:  Scott Riddle, Scott MR#:  [id number] DATE OF BIRTH:  10/25/1950  DATE OF ADMISSION:  06/29/2011  PRIMARY CARE PHYSICIAN: None local. REFERRING PHYSICIAN:  Dr. Mindi JunkerGottlieb   CHIEF COMPLAINT:  Fever and ran out of morphine today.  HISTORY OF PRESENT ILLNESS: The patient is a 64 year old Caucasian male with a history of hypertension, diabetes, coronary artery disease status post stent, chronic back pain on morphine, and hyperlipidemia who presented to the ED with fever today. The patient is alert, awake, oriented and in no acute distress. The patient said he crushed three tablets of morphine, which is his last morphine, at home and then injected it in his arm. Later he became confused and developed a fever. He said his wife called the ambulance since she was afraid of morphine withdrawal due to a shortage of morphine. The patient said he suspects that his son, who is a 62 years old, stole his morphine so he is a short of morphine. The patient had a fever of 101.5 and tachycardia in the ED . He was treated with Rocephin and vancomycin. Blood culture was sent. The patient denies any chills. No chest pain, palpitations, or orthopnea. No cough, sputum, or shortness of breath. The patient said he usually injects morphine twice a month. In addition he had arm cellulitis due to injection about two years ago.   SOCIAL HISTORY: The patient has smoked one pack a day for more than 30 years. He denies any alcohol drinking, but sometimes injects morphine.   PAST MEDICAL HISTORY:  1. Hypertension.  2. Diabetes. 3. Coronary artery disease. 4. Chronic back pain on morphine. 5. Hyperlipidemia.   PAST SURGICAL HISTORY:  Back surgery.   ALLERGIES: Neurontin, Plavix.   HOME MEDICATIONS:  1. Bupropion 150 mg p.o. b.i.d.  2. Lisinopril 10 mg p.o. daily.  3. Metformin 500 mg p.o. b.i.d.  4. Lopressor 50-mg tablet,  0.5 tablet p.o. q.12 hours.  5. Morphine 30 mg extended-release, 2 tablets p.o. b.i.d. for  pain.  6. Rosuvastatin 40 mg p.o. at bedtime.  7. Sertraline 100 mg p.o. daily.   REVIEW OF SYSTEMS: CONSTITUTIONAL: The patient has a fever, but denies chills, headache, or dizziness. No weakness. EYES: No double vision, blurred vision, or glaucoma.  ENT: No postnasal drip or epistaxis. No dysphagia or slurred speech. RESPIRATORY: No cough, sputum, shortness of breath, or hemoptysis. CARDIOVASCULAR: No chest pain, palpitations, orthopnea, or nocturnal dyspnea. No leg edema. GI: Positive for nausea but no vomiting, diarrhea, or abdominal pain. No melena or bloody stool. GU: No dysuria or hematuria.  ENDOCRINE: No polyuria or polydipsia. No heat or cold intolerance. HEMATOLOGY: No easy bleeding or bleeding. NEUROLOGY: No loss of consciousness or syncope or seizure but had a short period of confusion. PSYCHIATRIC: No depression. No anxiety.   PHYSICAL EXAMINATION:  VITALS: Temperature 103.7, blood pressure 164/110, pulse 125, respirations 24, oxygen saturation 95% on room air.   GENERAL: The patient is alert, awake, oriented, in no acute distress.   HEENT: Pupils round, equal, and reactive to light and accommodation. Moist oral mucosa. Clear oropharynx.   NECK: Supple. No JVD or carotid bruits. No lymphadenopathy. No thyromegaly.   CARDIOVASCULAR: S1, S2, regular rate, rhythm. No murmurs or gallops.   PULMONARY: Bilateral air entry. No wheezing or rales.   ABDOMEN: Obese, soft. No distention or tenderness. No organomegaly. Bowel sounds present.   EXTREMITIES: No edema, clubbing, or cyanosis. No calf tenderness. There is an injection site on the right hand  but no  erythema or tenderness.   NEUROLOGY: Alert and oriented times three. No focal deficit. Power five out of five. Sensation intact. Deep tendon reflexes 2+.   LABORATORY, DIAGNOSTIC, AND RADIOLOGICAL DATA: Glucose 119, BUN 15, creatinine 1.04. Electrolytes are normal. Acetaminophen less than 2. Ethanol level less than 3. WBC 5.3,  hemoglobin 14.7, platelets 132, TSH 1.18. Chest x-ray unremarkable. EKG shows sinus tachycardia at 125 beats per minute. Right ventricular hypertrophy.   IMPRESSION:  1. SIRS. Rule out bacteremia or sepsis.  2. Drug abuse.  3. Sinus tachycardia.  4. Tobacco use.  5. Thrombocytopenia.  6 Hypertension, uncontrolled.   SECONDARY DIAGNOSES:   1. Diabetes.  2. Coronary artery disease.  3. Chronic back pain.  4. Hyperlipidemia.   PLAN OF TREATMENT:  1. The patient will be admitted to the telemetry floor. We will continue antibiotics including Rocephin and vancomycin. We will follow up blood culture CBC, BMP, and urinalysis. 2. Uncontrolled hypertension. We will continue Lopressor, lisinopril, and give hydralazine p.r.n. IV.  3. Diabetes. We will hold metformin and start sliding scale.  4. GI and deep vein thrombosis prophylaxis.  5. Smoking cessation was counseled.   I discussed the patient's situation and plan of treatment with the patient.   TIME SPENT: About 65 minutes.   ____________________________ Shaune Pollack, MD qc:bjt D:  06/29/2011 15:56:50 ET          T: 06/29/2011 16:29:17 ET        JOB#: [id number] Shaune Pollack MD ELECTRONICALLY SIGNED 06/29/2011 22:54

## 2014-06-20 NOTE — Discharge Summary (Signed)
PATIENT NAME:  Scott Riddle, Khasir MR#:  [id number] DATE OF BIRTH:  [birth year]  DATE OF ADMISSION:  06/29/2011 DATE OF DISCHARGE:  07/01/2011  [facility] [facility] [facility]   The patient was admitted 06/29/2011, signed out AGAINST MEDICAL ADVICE 07/01/2011 early morning.   FINAL DIAGNOSES:  1. Fever, unclear cause.  2. Pain medication abuse.  3. Sleep apnea.  4. Hypertension and bradycardia.  5. History of coronary artery disease.  6. Diabetes.  7. Hyperlipidemia.   MEDICATIONS THAT THE PATIENT TAKES ON A DAILY BASIS:  1. Wellbutrin 150 mg twice a day. 2. Lisinopril 10 mg daily.  3. Metformin 500 mg b.i.d.  4. Lopressor 50 mg half a tablet twice a day. 5. Morphine 30 mg extended-release 5 tablets a day mostly at night. 6. Liquid morphine 20 mg every four hours as needed for pain.  7. Crestor 40 mg daily.  8. Zoloft 100 mg daily.   REASON FOR ADMISSION: The patient was admitted 06/29/2011 with fever and he ran out of his morphine.   HISTORY OF PRESENT ILLNESS: The patient is a 64 year old man with hypertension, diabetes, heart disease, chronic back pain on morphine, and hyperlipidemia who presented to the Emergency Room with fever. The patient is alert, awake, and oriented. His last 3 pills of the MS Contin he scraped off the covering and then injected them into his arm. He became confused and developed a fever. Wife called the ambulance. The patient suspects that his son has been taking his medications and he has been running short. In the ER, he had a fever of 103, was started with Rocephin and vancomycin. He was admitted with systemic inflammatory response syndrome, drug abuse, and tobacco abuse.   LABORATORY AND RADIOLOGICAL DATA DURING THE HOSPITAL COURSE: TSH 1.18. Salicylates 2.5. White blood count 5.3, hemoglobin and hematocrit 14.7 and 43.5, platelet count 132. Ethanol level less than 3. Glucose 119, BUN 15, creatinine 1.04, sodium 137, potassium 4.0, chloride 102, CO2 26,  calcium 9.0. Liver function tests normal. Acetaminophen level less than 2. Blood cultures negative for 48 hours. Urinalysis 1+ blood. Urine toxicology positive for opiates. Chest x-ray showed no acute changes. Hemoglobin A1c 6.2. Repeat urinalysis negative. Last white blood cell count 9.7.   HOSPITAL COURSE PER PROBLEM LIST:  1. For the patient's fever and systemic inflammatory response syndrome, the patient thinks it was secondary to injecting the codeine of the MS Contin. I stopped the antibiotics. The patient was afebrile overnight. This could be the possibility of his fever or a viral cause. The patient off antibiotics temperature curve was okay.  2. Pain medication abuse. [facility]. I would be very careful in prescribing this person pain medications in the future since he injects his pain medications. The patient wanted to me to give him prescriptions up until the 8th until his medications came in. The patient signed out AGAINST MEDICAL ADVICE prior to discharge so that means no medications from me. [facility]. Again, I would be very uncomfortable prescribing medications to somebody who will inject it. 3. For his sleep apnea, CPAP was given at night.  4. For his hypertension and bradycardia, I decreased the metoprolol dose.  5. For his history of heart disease, he is on aspirin and metoprolol.  6. For his diabetes, he is on metformin.  7. For his hyperlipidemia, he is on Crestor.   I did not see the patient on 07/01/2011. He signed  out AGAINST MEDICAL ADVICE before my shift started. I did see the patient last on 06/30/2011.   ____________________________ Herschell Dimes. Renae Gloss, MD rjw:drc D: 07/01/2011 15:11:34 ET T: 07/01/2011 15:31:44 ET JOB#: [id number]  cc: Herschell Dimes. Renae Gloss, MD, <Dictator> [facility] [place] Scarlet MD ELECTRONICALLY SIGNED 07/05/2011 17:06

## 2014-07-24 ENCOUNTER — Emergency Department
Admission: EM | Admit: 2014-07-24 | Discharge: 2014-07-24 | Disposition: A | Discharge status: Home / Self Care | Payer: Medicare Other | Attending: Emergency Medicine | Admitting: Emergency Medicine

## 2014-07-24 ENCOUNTER — Emergency Department: Payer: Medicare Other

## 2014-07-24 ENCOUNTER — Other Ambulatory Visit: Payer: Self-pay

## 2014-07-24 DIAGNOSIS — E119 Type 2 diabetes mellitus without complications: Secondary | ICD-10-CM | POA: Diagnosis not present

## 2014-07-24 DIAGNOSIS — M549 Dorsalgia, unspecified: Secondary | ICD-10-CM | POA: Insufficient documentation

## 2014-07-24 DIAGNOSIS — R079 Chest pain, unspecified: Secondary | ICD-10-CM | POA: Diagnosis not present

## 2014-07-24 DIAGNOSIS — I712 Thoracic aortic aneurysm, without rupture: Secondary | ICD-10-CM

## 2014-07-24 DIAGNOSIS — R609 Edema, unspecified: Secondary | ICD-10-CM

## 2014-07-24 DIAGNOSIS — I7122 Aneurysm of the aortic arch, without rupture: Secondary | ICD-10-CM

## 2014-07-24 DIAGNOSIS — G8929 Other chronic pain: Secondary | ICD-10-CM | POA: Insufficient documentation

## 2014-07-24 DIAGNOSIS — Z87891 Personal history of nicotine dependence: Secondary | ICD-10-CM | POA: Diagnosis not present

## 2014-07-24 DIAGNOSIS — M7989 Other specified soft tissue disorders: Secondary | ICD-10-CM | POA: Diagnosis present

## 2014-07-24 HISTORY — DX: Thoracic aortic aneurysm, without rupture: I71.2

## 2014-07-24 HISTORY — DX: Type 2 diabetes mellitus without complications: E11.9

## 2014-07-24 HISTORY — DX: Aneurysm of the aortic arch, without rupture: I71.22

## 2014-07-24 HISTORY — DX: Heart failure, unspecified: I50.9

## 2014-07-24 LAB — BASIC METABOLIC PANEL
Anion gap: 11 (ref 5–15)
BUN: 15 mg/dL (ref 6–20)
CALCIUM: 9.2 mg/dL (ref 8.9–10.3)
CO2: 30 mmol/L (ref 22–32)
CREATININE: 0.97 mg/dL (ref 0.61–1.24)
Chloride: 96 mmol/L — ABNORMAL LOW (ref 101–111)
GFR calc Af Amer: >60 mL/min (ref >60)
GFR calc non Af Amer: >60 mL/min (ref >60)
GLUCOSE: 138 mg/dL — AB (ref 65–99)
POTASSIUM: 3.9 mmol/L (ref 3.5–5.1)
SODIUM: 137 mmol/L (ref 135–145)

## 2014-07-24 LAB — CBC
HCT: 45.7 % (ref 40.0–52.0)
Hemoglobin: 15.4 g/dL (ref 13.0–18.0)
MCH: 28.3 pg (ref 26.0–34.0)
MCHC: 33.8 g/dL (ref 32.0–36.0)
MCV: 83.8 fL (ref 80.0–100.0)
PLATELETS: 140 10*3/uL — AB (ref 150–440)
RBC: 5.46 MIL/uL (ref 4.40–5.90)
RDW: 13.8 % (ref 11.5–14.5)
WBC: 9.3 10*3/uL (ref 3.8–10.6)

## 2014-07-24 LAB — BRAIN NATRIURETIC PEPTIDE: B NATRIURETIC PEPTIDE 5: 66 pg/mL (ref 0.0–100.0)

## 2014-07-24 LAB — TROPONIN I: Troponin I: <0.03 ng/mL (ref <0.031)

## 2014-07-24 MED ORDER — MORPHINE SULFATE 4 MG/ML IJ SOLN: 8.0000 mg | Freq: Once | INTRAMUSCULAR | Status: DC

## 2014-07-24 MED ORDER — MORPHINE SULFATE 30 MG PO TABS: 60.0000 mg | ORAL_TABLET | Freq: Two times a day (BID) | ORAL | Status: DC

## 2014-07-24 MED ORDER — MORPHINE SULFATE 4 MG/ML IJ SOLN
INTRAMUSCULAR | Status: AC
  Filled 2014-07-24: qty 2

## 2014-07-24 MED ORDER — MORPHINE SULFATE 4 MG/ML IJ SOLN
8.0000 mg | Freq: Once | INTRAMUSCULAR | Status: AC
  Administered 2014-07-24: 8 mg via INTRAVENOUS

## 2014-07-24 NOTE — ED Provider Notes (Signed)
[facility] Emergency Department Provider Note  ____________________________________________  Time seen: 1902  I have reviewed the triage vital signs and the nursing notes.   HISTORY  Chief Complaint Leg Swelling  feeling of edema, swelling in hands and legs.     HPI Scott Riddle is a 64 y.o. male who is a history of congestive heart failure. He takes to [facility] some mild at home as needed. He has been doing well lately and not needing the medication until about 3 days ago. 3 days ago he felt he was holding onto additional fluid and he began taking his when necessary torsemide. He says it is making him P but it is not fixing the edema that he is feeling.  Today he has taken 2 pills twice. He denies any shortness of breath. He has not having any orthopnea. He sleeps recumbent on his right lateral side and is not waking up with shortness of breath. He does use CPAP.  He denies chest pain today. He reports whenever he has any chest pain he takes nitroglycerin. The last time he took that was yesterday.  He is a patient at the [place].He went there earlier today for evaluation. Apparently became frustrated with the wait after 6 hours left. He does take morphine at a high dose for his chronic pain (120 mg every 4 hours) and is now saying he feels a little uncomfortable and agitated as he has not had any of this medication since this morning.   Past Medical History  Diagnosis Date  . CHF (congestive heart failure)   . Diabetes mellitus without complication   . 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   History reviewed. No pertinent past surgical history.  No current outpatient prescriptions on file.  Allergies Neurontin and Plavix  History reviewed. No pertinent family history.  Social History History  Substance Use Topics  . Smoking  status: Former Games developer  . Smokeless tobacco: Not on file  . Alcohol Use: No    Review of Systems Constitutional: Negative for fever. ENT: Negative for sore throat. Cardiovascular: Positive for some chest pain yesterday. Patient is concerned about congestive heart failure today. See history of present illness. Respiratory: Negative for shortness of breath. Gastrointestinal: Negative for abdominal pain, vomiting and diarrhea. Genitourinary: Negative for dysuria. Musculoskeletal: Patient has chronic back pain. He is concerned about diffuse swelling. Skin: Negative for rash. Neurological: Negative for headaches   10-point ROS otherwise negative.  ____________________________________________   PHYSICAL EXAM:  VITAL SIGNS: ED Triage Vitals  Enc Vitals Group     BP --      Pulse --      Resp --      Temp --      Temp src --      SpO2 --      Weight --      Height --      Head Cir --      Peak Flow --      Pain Score --      Pain Loc --      Pain Edu? --      Excl. in GC? --     Constitutional: Alert and oriented. no distress. ENT   Head: Normocephalic and atraumatic.   Nose: No congestion/rhinnorhea.   Mouth/Throat: Mucous membranes are moist. Cardiovascular: Normal  rate, regular rhythm. Respiratory: Normal respiratory effort without tachypnea. Breath sounds are clear and equal bilaterally. No wheezes/rales/rhonchi. Gastrointestinal: Soft and nontender. No distention.  Musculoskeletal: Nontender with normal range of motion in all extremities.  There is mild diffuse edema in his lower extremities and some appreciable in his hands, but there is no pitting edema.. Neurologic:  Normal speech and language. No gross focal neurologic deficits are appreciated.  Skin:  Skin is warm, dry. No rash noted. Psychiatric: Mood and affect are normal. Speech and behavior are normal.  ____________________________________________    LABS (pertinent  positives/negatives)  Troponin negative BNP 66 CBC 9.3 hemoglobin of 15.4. Metabolic panel within normal limits. Glucose of 138.  ____________________________________________   EKG  ED ECG REPORT I, Zavien Clubb W, the attending physician, personally viewed and interpreted this ECG.   Date: 07/24/2014  EKG Time: 1928   Rate: 60  Rhythm: Sinus rhythm with premature atrial complexes  Axis: normal  Intervals:  Normal  ST&T Change:  None   ____________________________________________    RADIOLOGY  CXR  IMPRESSION: No evidence of acute cardiopulmonary disease.  _______________________  INITIAL IMPRESSION / ASSESSMENT AND PLAN / ED COURSE  After the initial history, I find the patient is on high-dose morphine for his chronic back pain. He takes 120 mg 4 times a day and has not had any medication since this morning. He began to feel a little agitated in the emergency department. We treated him with 8 mg of morphine IV. Regular treat him with a longer acting morphine as well but this tied him over. We do not have his higher dose long-acting morphine readily available. I reassessed the patient and discussed with him his peripheral edema and we have agreed that he will go home and take his pain medicine and assigned to.  He does not have any signs of congestive heart failure. It's unclear why he is having such a diffuse peripheral edema. We will have him continue his torsemide at home. He will [facility].  ____________________________________________   FINAL CLINICAL IMPRESSION(S) / ED DIAGNOSES  Final diagnoses:  Peripheral edema      Darien Ramusavid W Theotis Gerdeman, MD 07/24/14 2117

## 2014-07-24 NOTE — ED Notes (Signed)
Pt sleeping now after morphine administration. Sinus bradycardia on monitor, rate of 56. Cont pox in place.

## 2014-07-24 NOTE — ED Notes (Signed)
Pt reporting LE swelling with leg cramps, took extra dose of torsemide with no relief, went to [facility] and wait was long so he came home. He stated he developed some SOB so he called EMS, SOB resolved before EMS arrived. Pt denies any CP at this time.

## 2014-07-24 NOTE — ED Notes (Signed)
Pt awake, states "that morphine wore off, did you say something about a pill earlier, can i have it now?" pt with pwd skin, no resp distress noted. Pt again states takes `20mg  er morphine very 6 hours.

## 2014-07-24 NOTE — ED Notes (Signed)
Pt states takes 120mg  of morphine extended release for chornic back pain every 6 hours and has not had any since 8 am. Pt requesting pain medication. md notified.

## 2014-07-24 NOTE — Discharge Instructions (Signed)
Peripheral Edema °You have swelling in your legs (peripheral edema). This swelling is due to excess accumulation of salt and water in your body. Edema may be a sign of heart, kidney or liver disease, or a side effect of a medication. It may also be due to problems in the leg veins. Elevating your legs and using special support stockings may be very helpful, if the cause of the swelling is due to poor venous circulation. Avoid long periods of standing, whatever the cause. °Treatment of edema depends on identifying the cause. Chips, pretzels, pickles and other salty foods should be avoided. Restricting salt in your diet is almost always needed. Water pills (diuretics) are often used to remove the excess salt and water from your body via urine. These medicines prevent the kidney from reabsorbing sodium. This increases urine flow. °Diuretic treatment may also result in lowering of potassium levels in your body. Potassium supplements may be needed if you have to use diuretics daily. Daily weights can help you keep track of your progress in clearing your edema. You should call your caregiver for follow up care as recommended. °SEEK IMMEDIATE MEDICAL CARE IF:  °· You have increased swelling, pain, redness, or heat in your legs. °· You develop shortness of breath, especially when lying down. °· You develop chest or abdominal pain, weakness, or fainting. °· You have a fever. °Document Released: 03/22/2004 Document Revised: 05/07/2011 Document Reviewed: 03/02/2009 °ExitCare® Patient Information ©2015 [employer]. This information is not intended to replace advice given to you by your health care provider. Make sure you discuss any questions you have with your health care provider. ° °

## 2018-01-07 ENCOUNTER — Emergency Department: Payer: Medicare Other

## 2018-01-07 ENCOUNTER — Emergency Department
Admission: EM | Admit: 2018-01-07 | Discharge: 2018-01-07 | Disposition: A | Discharge status: Home / Self Care | Payer: Medicare Other | Attending: Emergency Medicine | Admitting: Emergency Medicine

## 2018-01-07 ENCOUNTER — Other Ambulatory Visit: Payer: Self-pay

## 2018-01-07 DIAGNOSIS — F17210 Nicotine dependence, cigarettes, uncomplicated: Secondary | ICD-10-CM | POA: Diagnosis not present

## 2018-01-07 DIAGNOSIS — I509 Heart failure, unspecified: Secondary | ICD-10-CM | POA: Insufficient documentation

## 2018-01-07 DIAGNOSIS — E119 Type 2 diabetes mellitus without complications: Secondary | ICD-10-CM | POA: Diagnosis not present

## 2018-01-07 DIAGNOSIS — R55 Syncope and collapse: Secondary | ICD-10-CM | POA: Insufficient documentation

## 2018-01-07 DIAGNOSIS — R001 Bradycardia, unspecified: Secondary | ICD-10-CM | POA: Insufficient documentation

## 2018-01-07 DIAGNOSIS — R42 Dizziness and giddiness: Secondary | ICD-10-CM

## 2018-01-07 LAB — COMPREHENSIVE METABOLIC PANEL
ALBUMIN: 4 g/dL (ref 3.5–5.0)
ALT: 17 U/L (ref 0–44)
AST: 28 U/L (ref 15–41)
Alkaline Phosphatase: 51 U/L (ref 38–126)
Anion gap: 8 (ref 5–15)
BUN: 23 mg/dL (ref 8–23)
CHLORIDE: 101 mmol/L (ref 98–111)
CO2: 27 mmol/L (ref 22–32)
Calcium: 9.1 mg/dL (ref 8.9–10.3)
Creatinine, Ser: 1.25 mg/dL — ABNORMAL HIGH (ref 0.61–1.24)
GFR calc Af Amer: >60 mL/min (ref >60)
GFR calc non Af Amer: 59 mL/min — ABNORMAL LOW (ref >60)
GLUCOSE: 207 mg/dL — AB (ref 70–99)
Potassium: 4.2 mmol/L (ref 3.5–5.1)
Sodium: 136 mmol/L (ref 135–145)
Total Bilirubin: 0.8 mg/dL (ref 0.3–1.2)
Total Protein: 6.9 g/dL (ref 6.5–8.1)

## 2018-01-07 LAB — CBC WITH DIFFERENTIAL/PLATELET
ABS IMMATURE GRANULOCYTES: 0.08 10*3/uL — AB (ref 0.00–0.07)
BASOS PCT: 1 %
Basophils Absolute: 0.1 10*3/uL (ref 0.0–0.1)
Eosinophils Absolute: 0.2 10*3/uL (ref 0.0–0.5)
Eosinophils Relative: 2 %
HCT: 40.5 % (ref 39.0–52.0)
Hemoglobin: 13.3 g/dL (ref 13.0–17.0)
Immature Granulocytes: 1 %
Lymphocytes Relative: 22 %
Lymphs Abs: 2.3 10*3/uL (ref 0.7–4.0)
MCH: 28.2 pg (ref 26.0–34.0)
MCHC: 32.8 g/dL (ref 30.0–36.0)
MCV: 86 fL (ref 80.0–100.0)
MONOS PCT: 6 %
Monocytes Absolute: 0.6 10*3/uL (ref 0.1–1.0)
NEUTROS ABS: 6.9 10*3/uL (ref 1.7–7.7)
NEUTROS PCT: 68 %
PLATELETS: 130 10*3/uL — AB (ref 150–400)
RBC: 4.71 MIL/uL (ref 4.22–5.81)
RDW: 14 % (ref 11.5–15.5)
WBC: 10.1 10*3/uL (ref 4.0–10.5)
nRBC: 0 % (ref 0.0–0.2)

## 2018-01-07 LAB — TROPONIN I: Troponin I: <0.03 ng/mL (ref <0.03)

## 2018-01-07 MED ORDER — MECLIZINE HCL 25 MG PO TABS
50.0000 mg | ORAL_TABLET | Freq: Once | ORAL | Status: AC
  Administered 2018-01-07: 50 mg via ORAL
  Filled 2018-01-07: qty 2

## 2018-01-07 MED ORDER — IOPAMIDOL (ISOVUE-370) INJECTION 76%
125.0000 mL | Freq: Once | INTRAVENOUS | Status: AC | PRN
  Administered 2018-01-07: 150 mL via INTRAVENOUS

## 2018-01-07 MED ORDER — ONDANSETRON HCL 4 MG/2ML IJ SOLN
4.0000 mg | Freq: Once | INTRAMUSCULAR | Status: AC
  Administered 2018-01-07: 4 mg via INTRAVENOUS
  Filled 2018-01-07: qty 2

## 2018-01-07 MED ORDER — MECLIZINE HCL 25 MG PO TABS: 25.0000 mg | ORAL_TABLET | Freq: Three times a day (TID) | ORAL | 0 refills | Status: AC | PRN

## 2018-01-07 NOTE — ED Notes (Signed)
Pt sleeping, Meal tray placed at bedside at this time

## 2018-01-07 NOTE — Progress Notes (Signed)
   01/07/18 0215  Clinical Encounter Type  Visited With Patient  Visit Type Initial;Spiritual support;ED  Referral From Nurse  Consult/Referral To Chaplain  Spiritual Encounters  Spiritual Needs Prayer;Other (Comment)   CH received a page from the ED that patient was enroute and was emergency traffic. The patient was alert without family. The RN said the patient was stable and appeared to have no needs at this time. I prayed for the patient and will follow up if needed.

## 2018-01-07 NOTE — ED Notes (Signed)
[place] [facility] checking into transfer   1218

## 2018-01-07 NOTE — ED Notes (Signed)
Pt states he is currently nauseous and vomited x 1 around 0745

## 2018-01-07 NOTE — ED Notes (Signed)
Assumed care of patient.

## 2018-01-07 NOTE — ED Triage Notes (Signed)
Patient arrived via [facility] from home due to dizzines.  Per [facility], patient was pale and diaphoretic on arrival. Patient also vomited when they were loading patient onto the stretcher.  Patient has a history of AAA and HTN.  Patient was given 4 baby aspirin, and zofran by [facility]. Patients CBG was 231 per [facility].  Patient received of lactated ringers by [facility]. Patient denies pain at this time.

## 2018-01-07 NOTE — ED Notes (Signed)
Resumed care from lorrie rn.  Pt alert.  Speech clear.    Pt watching tv.  Iv in place.  Sinus brady on monitor.  Skin warm and dry.  No chest pain.  Pt reports dizziness.

## 2018-01-07 NOTE — ED Provider Notes (Signed)
Patient reports he feels better.  He wants to go home.  I discussed with him the fact that his heart rate was slow and it would be safer if he went down to [facility] as planned.  He does not want to do this anymore.  He just wants his Antivert which made him feel a lot better to go home.  He will follow-up with his doctor as soon as possible and he will return if worse.   Arnaldo NatalMalinda,  F, MD 01/07/18 [phone]

## 2018-01-07 NOTE — ED Notes (Signed)
Patient resting quietly at this time.

## 2018-01-07 NOTE — ED Provider Notes (Addendum)
[facility] Emergency Department Provider Note  ____________________________________________  Time seen: Approximately 2:26 AM  I have reviewed the triage vital signs and the nursing notes.   HISTORY  Chief Complaint Dizziness    HPI Scott Riddle is a 67 y.o. male with a history of aortic aneurysm CHF and diabetes as well as a history of 2 cardiac stents in the past and vascular bypass surgery on the right lower leg  who started having lightheadedness this evening.  He called EMS who on arrival found the patient to be pale and diaphoretic and then starting to vomit.  They brought him to the ED emergency traffic despite normal vital signs out of concern for aortic aneurysm complication.  Patient states that other than feeling dizzy he has no pain.  He reports that this is similar to how he felt in the past when he had cardiac issues requiring PCI.  No belly pain back pain or extremity weakness or paresthesia.  No trauma     Past Medical History:  Diagnosis Date  . 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 CHF (congestive heart failure) (HCC)   . Diabetes mellitus without complication (HCC)      There are no active problems to display for this patient.    History reviewed. No pertinent surgical history.   Prior to Admission medications   Not on File     Allergies Neurontin [gabapentin] and Plavix [clopidogrel bisulfate]   History reviewed. No pertinent family history.  Social History Social History   Tobacco Use  . Smoking status: Current Every Day Smoker    Packs/day: 1.00    Types: Cigarettes  . Smokeless tobacco: Never Used  Substance Use Topics  . Alcohol use: No  . Drug use: No    Review of Systems  Constitutional:   No fever or chills.  ENT:   No sore throat. No rhinorrhea. Cardiovascular:   No chest pain or syncope. Respiratory:   No dyspnea or  cough. Gastrointestinal:   Negative for abdominal pain, vomiting and diarrhea.  Musculoskeletal:   Negative for focal pain or swelling All other systems reviewed and are negative except as documented above in ROS and HPI.  ____________________________________________   PHYSICAL EXAM:  VITAL SIGNS: ED Triage Vitals  Enc Vitals Group     BP      Pulse      Resp      Temp      Temp src      SpO2      Weight      Height      Head Circumference      Peak Flow      Pain Score      Pain Loc      Pain Edu?      Excl. in GC?     Vital signs reviewed, nursing assessments reviewed.   Constitutional:   Alert and oriented. Non-toxic appearance. Eyes:   Conjunctivae are normal. EOMI. PERRL. ENT      Head:   Normocephalic and atraumatic.      Nose:   No congestion/rhinnorhea.       Mouth/Throat:   MMM, no pharyngeal erythema. No peritonsillar mass.       Neck:   No meningismus. Full ROM. Hematological/Lymphatic/Immunilogical:   No cervical lymphadenopathy. Cardiovascular:   Irregularly irregular rhythm, bradycardia heart rate 40. Symmetric bilateral radial and DP  pulses.  No murmurs. Cap refill less than 2 seconds. Respiratory:   Normal respiratory effort without tachypnea/retractions. Breath sounds are clear and equal bilaterally. No wheezes/rales/rhonchi. Gastrointestinal:   Soft and nontender. Non distended. There is no CVA tenderness.  No rebound, rigidity, or guarding. Musculoskeletal:   Normal range of motion in all extremities. No joint effusions.  No lower extremity tenderness.  No edema. Neurologic:   Normal speech and language.  Motor grossly intact. No acute focal neurologic deficits are appreciated.  Skin:    Skin is warm, dry and intact. No rash noted.  No petechiae, purpura, or bullae.  ____________________________________________    LABS (pertinent positives/negatives) (all labs ordered are listed, but only abnormal results are displayed) Labs Reviewed   COMPREHENSIVE METABOLIC PANEL - Abnormal; Notable for the following components:      Result Value   Glucose, Bld 207 (*)    Creatinine, Ser 1.25 (*)    GFR calc non Af Amer 59 (*)    All other components within normal limits  CBC WITH DIFFERENTIAL/PLATELET - Abnormal; Notable for the following components:   Platelets 130 (*)    Abs Immature Granulocytes 0.08 (*)    All other components within normal limits  TROPONIN I   ____________________________________________   EKG  Interpreted by me Atrial fibrillation, bradycardia, rate of 46.  Right axis, right bundle branch block.  No acute ischemic changes  ____________________________________________    RADIOLOGY  Ct Angio Chest/abd/pel For Dissection W And/or Wo Contrast  Result Date: 01/07/2018 CLINICAL DATA:  Acute onset of dizziness. Diaphoresis and vomiting. EXAM: CT ANGIOGRAPHY CHEST, ABDOMEN AND PELVIS TECHNIQUE: Multidetector CT imaging through the chest, abdomen and pelvis was performed using the standard protocol during bolus administration of intravenous contrast. Multiplanar reconstructed images and MIPs were obtained and reviewed to evaluate the vascular anatomy. CONTRAST:  ISOVUE-370 IOPAMIDOL (ISOVUE-370) INJECTION 76% COMPARISON:  Chest radiograph performed 07/24/2014, and CTA of the chest performed 01/31/2012 FINDINGS: CTA CHEST FINDINGS Cardiovascular: There is no evidence of aortic dissection. There is no evidence of aneurysmal dilatation. Minimal calcification is noted at the aortic arch. There is no evidence of significant pulmonary embolus. The heart is normal in size. The great vessels are unremarkable in appearance. Mediastinum/Nodes: Scattered coronary artery calcifications are seen. The mediastinum is otherwise unremarkable. No mediastinal lymphadenopathy is seen. No pericardial effusion is identified. The visualized portions of the thyroid gland are unremarkable. No axillary lymphadenopathy is seen.  Lungs/Pleura: Minimal bilateral peripheral atelectasis or scarring is noted. No pleural effusion or pneumothorax is seen. No masses are identified. Musculoskeletal: No acute osseous abnormalities are identified. The visualized musculature is unremarkable in appearance. Review of the MIP images confirms the above findings. CTA ABDOMEN AND PELVIS FINDINGS VASCULAR Aorta: There is aneurysmal dilatation of the abdominal aorta to 5.6 cm in AP dimension and 5.5 cm in transverse dimension, beginning distal to the renal arteries and resolving just proximal to the aortic bifurcation. Associated somewhat irregular mural thrombus is noted. Celiac: The celiac trunk appears intact. SMA: The superior mesenteric artery is unremarkable in appearance. Renals: The renal arteries appear intact bilaterally. IMA: The inferior mesenteric artery appears to be occluded. Inflow: There appears to be mild luminal narrowing at the origin of the left common iliac artery. The right common iliac artery is somewhat ectatic, measuring 1.9 cm in diameter. Mild calcification is noted along the internal iliac arteries bilaterally, and at the common femoral arteries bilaterally. Veins: Visualized venous structures are grossly unremarkable. Review of the MIP  images confirms the above findings. NON-VASCULAR Hepatobiliary: The liver is unremarkable in appearance. The gallbladder is unremarkable in appearance. The common bile duct remains normal in caliber. Pancreas: The pancreas is within normal limits. Spleen: The spleen is unremarkable in appearance. Adrenals/Urinary Tract: The adrenal glands are unremarkable in appearance. The kidneys are within normal limits. There is no evidence of hydronephrosis. No renal or ureteral stones are identified. No perinephric stranding is seen. Stomach/Bowel: The stomach is unremarkable in appearance. The small bowel is within normal limits. The appendix is not visualized; there is no evidence for appendicitis. The colon  is unremarkable in appearance. Lymphatic: No retroperitoneal or pelvic sidewall lymphadenopathy is seen. Reproductive: The bladder is decompressed and not well characterized. The prostate is normal in size. Other: No additional soft tissue abnormalities are seen. Musculoskeletal: No acute osseous abnormalities are identified. The patient is status post lumbar spinal fusion at L4-S1. The visualized musculature is unremarkable in appearance. Review of the MIP images confirms the above findings. IMPRESSION: 1. No evidence of aortic dissection. 2. Aneurysmal dilatation of the abdominal aorta to 5.6 cm in AP dimension and 5.5 cm in transverse dimension, beginning distal to the renal arteries and resolving just proximal to the aortic bifurcation. Associated somewhat irregular mural thrombus noted. Vascular surgery consultation recommended due to increased risk of rupture for AAA >5.5 cm. This recommendation follows ACR consensus guidelines: White Paper of the ACR Incidental Findings Committee II on Vascular Findings. J Am Coll Radiol 2013; 10:789-794. 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 4. Mild luminal narrowing at the origin of the left common iliac artery. 5. Scattered coronary artery calcifications seen. 6. Minimal bilateral peripheral atelectasis or scarring noted. Lungs otherwise clear. Electronically Signed   By: Roanna Raider [name]   On: 01/07/2018 03:41    ____________________________________________   PROCEDURES Procedures  ____________________________________________  DIFFERENTIAL DIAGNOSIS   Non-STEMI, aortic dissection, ruptured aortic aneurysm, dehydration  CLINICAL IMPRESSION / ASSESSMENT AND PLAN / ED COURSE  Pertinent labs & imaging results that were available during my care of the patient were reviewed by me and considered in my medical decision making (see chart for details).    Patient presents with near syncope, bradycardia, pallor and diaphoresis for EMS.  Much of the  symptoms have improved by the arrival in the ED.  No pain syndrome.  Due to his known history, will proceed with CT angiogram chest abdomen pelvis as well as cardiac work-up.  If initial work-up is negative, will plan to hospitalize for further cardiac evaluation.  Patient confirms he gets all his care at the [place] and would like to be transferred to [facility] for admission.  Clinical Course as of Jan 07 545  Tue Jan 07, 2018  1610 CT does not show ruptured AAA or dissection.  There is a 5.5 cm infrarenal abdominal aortic aneurysm with mural thrombosis.   [PS]    Clinical Course User Index [PS] Sharman Cheek, MD     ----------------------------------------- 6:50 AM on 01/07/2018 -----------------------------------------  Initial work-up negative, labs normal.  Symptomatic bradycardia causing presyncope.  Awaiting transfer to the [facility] for further management.   ____________________________________________   FINAL CLINICAL IMPRESSION(S) / ED DIAGNOSES    Final diagnoses:  Near syncope  Symptomatic bradycardia   ED Discharge Orders    None      Portions of this note were generated with dragon dictation software. Dictation errors may occur despite best attempts at proofreading.    Sharman Cheek, MD 01/07/18 [phone]  Sharman CheekStafford, Storie Heffern, MD 01/07/18 [phone]

## 2018-01-07 NOTE — Discharge Instructions (Signed)
The safest thing for you to do would be to go down to the [place] for them to see you and check you out.  Your heart rate was slow when you got here and it could happen again.  I understand that you are feeling better now and that the spinning sensation is gone but it still would be safer to go down there.  Since she will not I will give you a prescription for the Antivert 1 3 times a day.  Be careful can make you a little sleepy since it is and antihistamine.  Please return here if you get any worsening.  Please make sure he follow-up with your cardiology doctor at the [facility] as soon as possible.

## 2018-01-07 NOTE — ED Notes (Signed)
Resting quietly with eyes closed. No acute distress noted.

## 2018-01-07 NOTE — ED Notes (Signed)
Dr Darnelle Catalanmalinda in with pt

## 2018-01-07 NOTE — ED Notes (Signed)
Pt given cup of ice and sprite.  

## 2018-01-07 NOTE — ED Notes (Signed)
Spoke to [place] AOD and reviewing transfer  1450

## 2018-01-07 NOTE — ED Notes (Addendum)
Pt up to bedside with male ed tech to use urinal at this time.

## 2018-01-07 NOTE — ED Notes (Signed)
Called to inquire about status  [phone] and [phone]

## 2018-01-07 NOTE — ED Notes (Signed)
Resting quietly at this time.

## 2018-01-07 NOTE — ED Notes (Signed)
Pt given given meal tray at this time.

## 2019-01-21 IMAGING — CT CT ANGIO CHEST-ABD-PELV FOR DISSECTION W/ AND WO/W CM
2 of 7 series · 13 of 46 positions shown, 15 images · IV contrast (APPLIED)
Comparison: Chest radiograph performed 07/24/2014, and CTA of the
chest performed 01/31/2012

CLINICAL DATA: Acute onset of dizziness. Diaphoresis and vomiting.

EXAM:
CT ANGIOGRAPHY CHEST, ABDOMEN AND PELVIS
TECHNIQUE: Multidetector CT imaging through the chest, abdomen and pelvis was
performed using the standard protocol during bolus administration of
intravenous contrast. Multiplanar reconstructed images and MIPs were
obtained and reviewed to evaluate the vascular anatomy.
CONTRAST:  150mL [id number] IOPAMIDOL ([id number]) INJECTION 76%

[Series 5: axial arterial · axial · arterial · 0.85mm/px · z∈[-943,-382]mm · 10 of 217 slices shown, 12 images]
[im 15/217  soft-tissue]
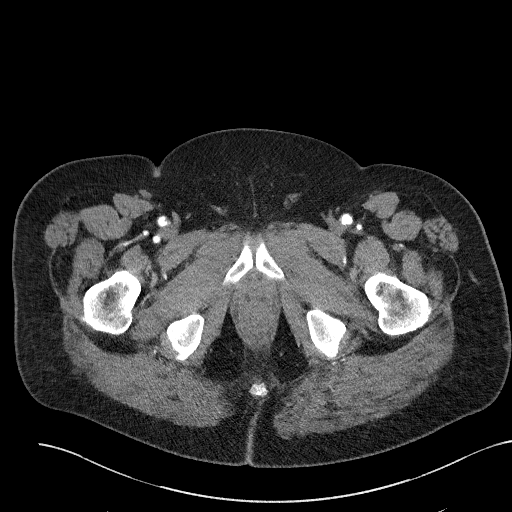
[im 15/217  bone]
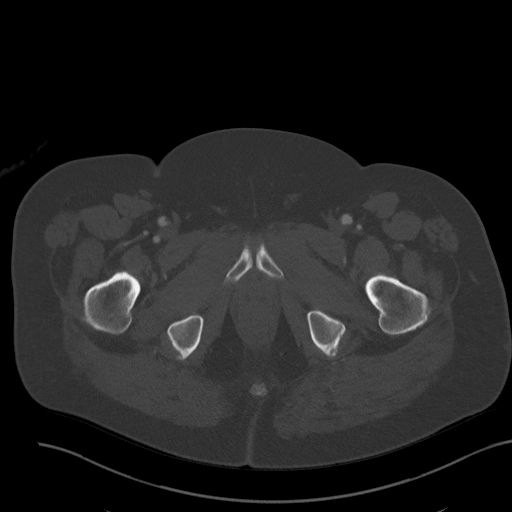
[im 44/217  soft-tissue]
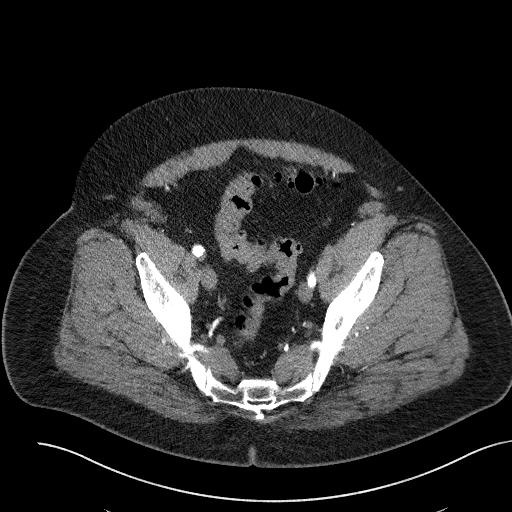
[im 58/217  soft-tissue]
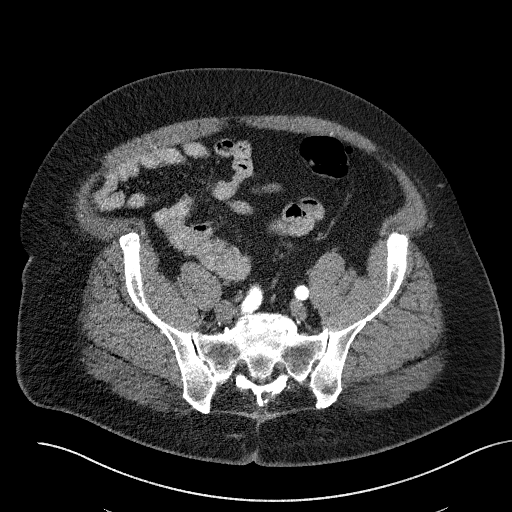
[im 73/217  soft-tissue]
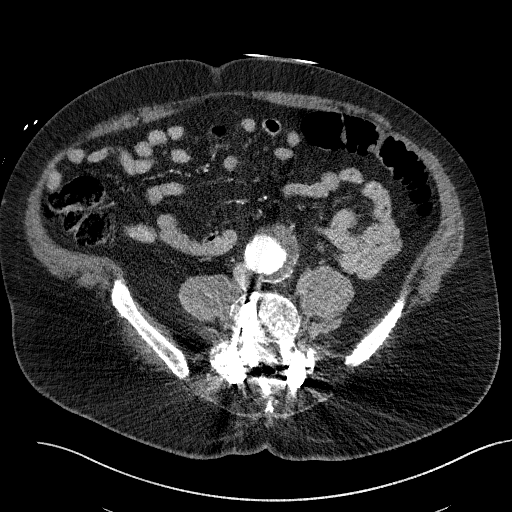
[im 101/217  soft-tissue]
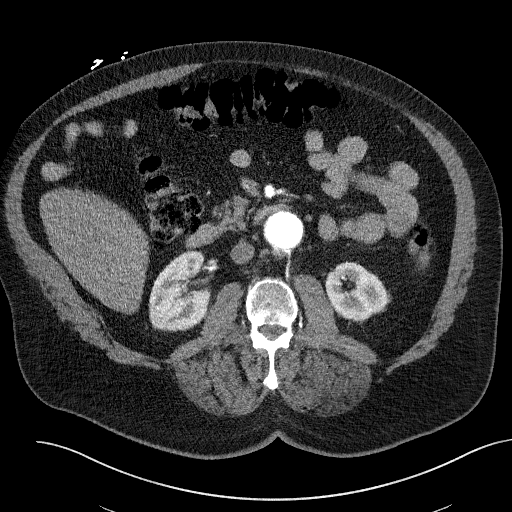
[im 116/217  soft-tissue]
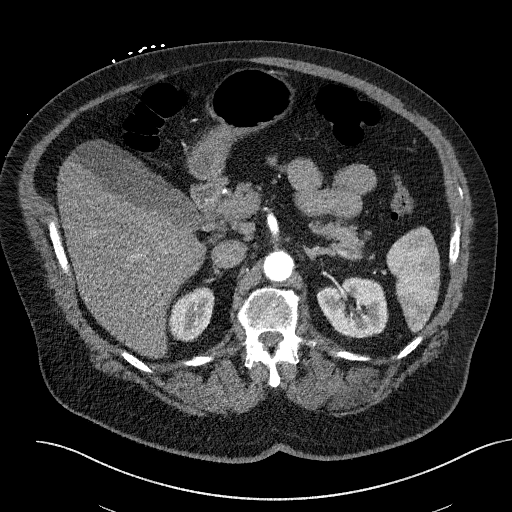
[im 145/217  soft-tissue]
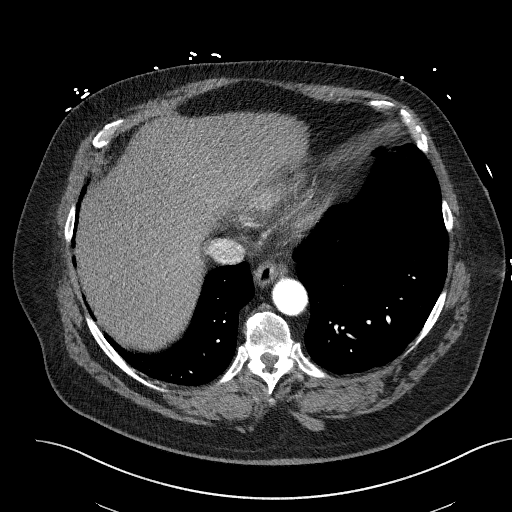
[im 159/217  soft-tissue]
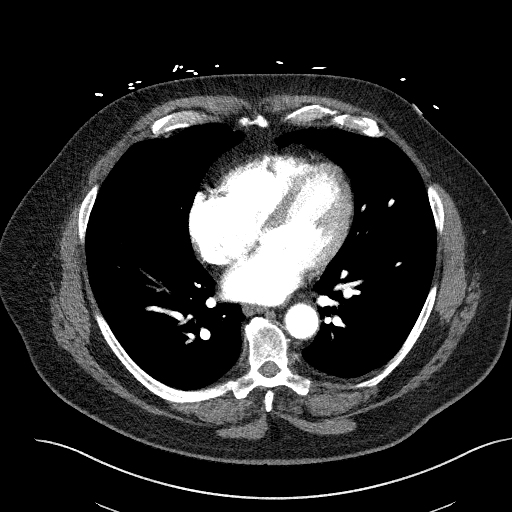
[im 173/217  soft-tissue]
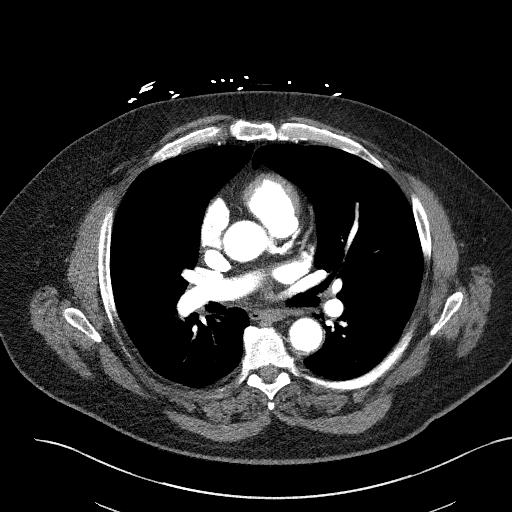
[im 173/217  bone]
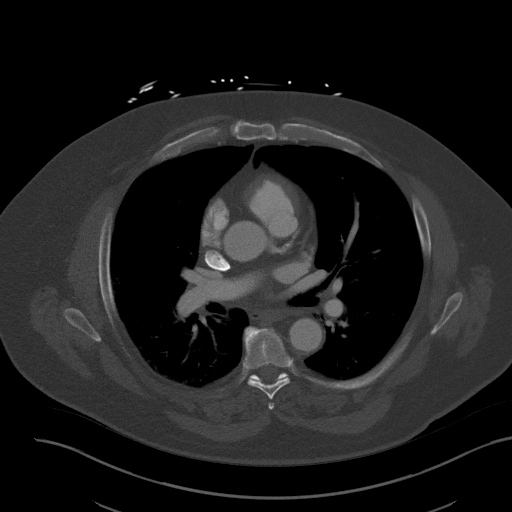
[im 202/217  soft-tissue]
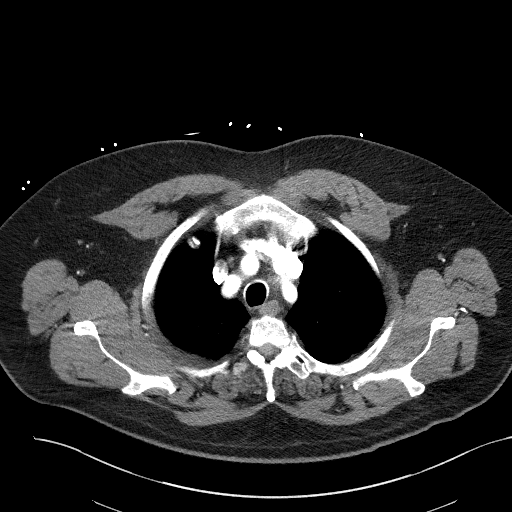

[Series 7: coronals · coronal · 0.90mm/px · 3 of 176 slices shown]
[im 44/176  soft-tissue]
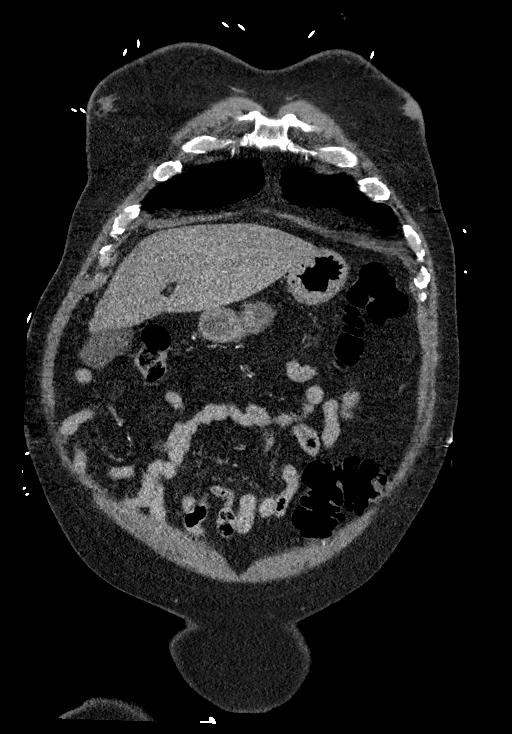
[im 88/176  soft-tissue]
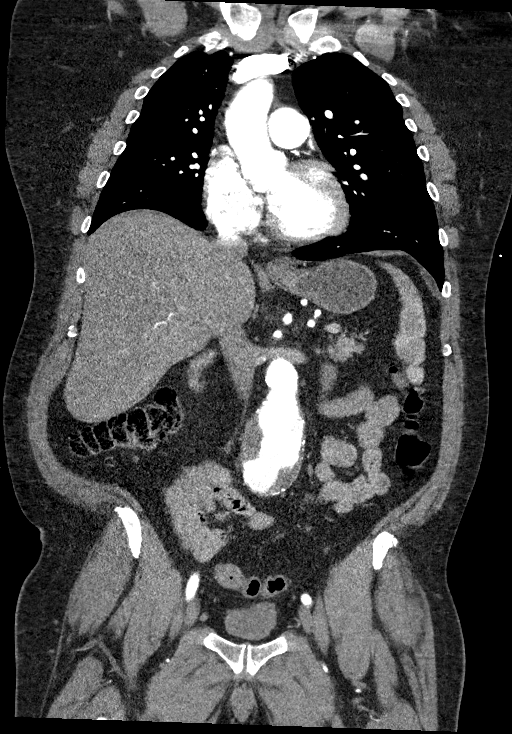
[im 132/176  soft-tissue]
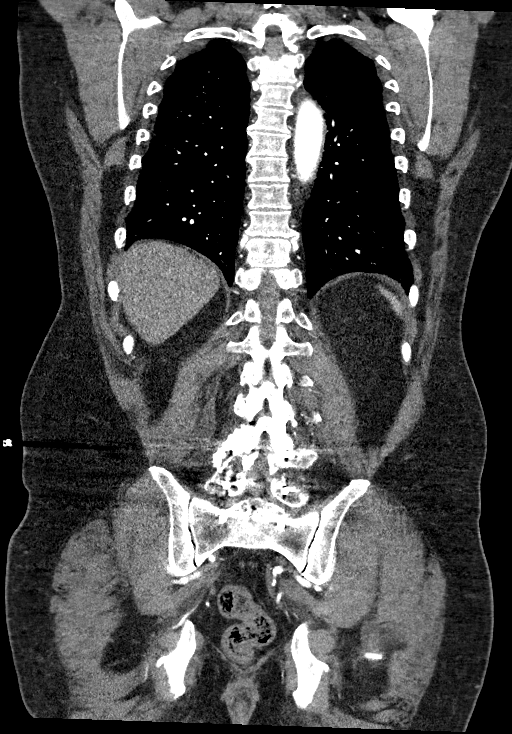

[13 of 46 positions shown; findings below may reference images not displayed]

FINDINGS: CTA CHEST FINDINGS

Cardiovascular: There is no evidence of aortic dissection. There is
no evidence of aneurysmal dilatation. Minimal calcification is noted
at the aortic arch.

There is no evidence of significant pulmonary embolus. The heart is
normal in size. The great vessels are unremarkable in appearance.

Mediastinum/Nodes: Scattered coronary artery calcifications are
seen. The mediastinum is otherwise unremarkable. No mediastinal
lymphadenopathy is seen. No pericardial effusion is identified. The
visualized portions of the thyroid gland are unremarkable. No
axillary lymphadenopathy is seen.

Lungs/Pleura: Minimal bilateral peripheral atelectasis or scarring
is noted. No pleural effusion or pneumothorax is seen. No masses are
identified.

Musculoskeletal: No acute osseous abnormalities are identified. The
visualized musculature is unremarkable in appearance.

Review of the MIP images confirms the above findings.

CTA ABDOMEN AND PELVIS FINDINGS

VASCULAR

Aorta: There is aneurysmal dilatation of the abdominal aorta to
cm in AP dimension and 5.5 cm in transverse dimension, beginning
distal to the renal arteries and resolving just proximal to the
aortic bifurcation. Associated somewhat irregular mural thrombus is
noted.

Celiac: The celiac trunk appears intact.

SMA: The superior mesenteric artery is unremarkable in appearance.

Renals: The renal arteries appear intact bilaterally.

IMA: The inferior mesenteric artery appears to be occluded.

Inflow: There appears to be mild luminal narrowing at the origin of
the left common iliac artery. The right common iliac artery is
somewhat ectatic, measuring 1.9 cm in diameter. Mild calcification
is noted along the internal iliac arteries bilaterally, and at the
common femoral arteries bilaterally.

Veins: Visualized venous structures are grossly unremarkable.

Review of the MIP images confirms the above findings.

NON-VASCULAR

Hepatobiliary: The liver is unremarkable in appearance. The
gallbladder is unremarkable in appearance. The common bile duct
remains normal in caliber.

Pancreas: The pancreas is within normal limits.

Spleen: The spleen is unremarkable in appearance.

Adrenals/Urinary Tract: The adrenal glands are unremarkable in
appearance. The kidneys are within normal limits. There is no
evidence of hydronephrosis. No renal or ureteral stones are
identified. No perinephric stranding is seen.

Stomach/Bowel: The stomach is unremarkable in appearance. The small
bowel is within normal limits. The appendix is not visualized; there
is no evidence for appendicitis. The colon is unremarkable in
appearance.

Lymphatic: No retroperitoneal or pelvic sidewall lymphadenopathy is
seen.

Reproductive: The bladder is decompressed and not well
characterized. The prostate is normal in size.

Other: No additional soft tissue abnormalities are seen.

Musculoskeletal: No acute osseous abnormalities are identified. The
patient is status post lumbar spinal fusion at L4-S1. The visualized
musculature is unremarkable in appearance.

Review of the MIP images confirms the above findings.
IMPRESSION: 1. No evidence of aortic dissection.
2. Aneurysmal dilatation of the abdominal aorta to 5.6 cm in AP
dimension and 5.5 cm in transverse dimension, beginning distal to
the renal arteries and resolving just proximal to the aortic
bifurcation. Associated somewhat irregular mural thrombus noted.
Vascular surgery consultation recommended due to increased risk of
rupture for AAA >5.5 cm. This recommendation follows ACR consensus
guidelines: White Paper of the ACR Incidental Findings Committee II
on Vascular Findings. [HOSPITAL] 8522; [DATE].
3. Occlusion of the inferior mesenteric artery.
4. Mild luminal narrowing at the origin of the left common iliac
artery.
5. Scattered coronary artery calcifications seen.
6. Minimal bilateral peripheral atelectasis or scarring noted. Lungs
otherwise clear.

## 2020-03-01 ENCOUNTER — Encounter: Payer: Self-pay | Admitting: Podiatry

## 2020-03-01 ENCOUNTER — Ambulatory Visit (INDEPENDENT_AMBULATORY_CARE_PROVIDER_SITE_OTHER): Payer: Medicare Other | Admitting: Podiatry

## 2020-03-01 ENCOUNTER — Other Ambulatory Visit: Payer: Self-pay

## 2020-03-01 DIAGNOSIS — M79674 Pain in right toe(s): Secondary | ICD-10-CM

## 2020-03-01 DIAGNOSIS — Z794 Long term (current) use of insulin: Secondary | ICD-10-CM | POA: Diagnosis not present

## 2020-03-01 DIAGNOSIS — B351 Tinea unguium: Secondary | ICD-10-CM | POA: Diagnosis not present

## 2020-03-01 DIAGNOSIS — M79675 Pain in left toe(s): Secondary | ICD-10-CM

## 2020-03-01 DIAGNOSIS — E119 Type 2 diabetes mellitus without complications: Secondary | ICD-10-CM | POA: Diagnosis not present

## 2020-03-01 NOTE — Progress Notes (Signed)
  Subjective:  Patient ID: READE TREFZ, male    DOB: [birth year],  MRN: [id number]  Chief Complaint  Patient presents with  . Nail Problem  . Diabetes    Nail trim and DFC   70 y.o. male returns for the above complaint.  Patient presents with thickened elongated dystrophic toenails x10.  Patient states is painful to touch painful to walk on.  He would like for the nails to be debrided down.  He is a diabetic with unknown A1c.  He states his sugars are controlled.  He presents from [place] [facility].  He denies any other acute complaints.  Objective:  There were no vitals filed for this visit. Podiatric Exam: Vascular: dorsalis pedis and posterior tibial pulses are palpable bilateral. Capillary return is immediate. Temperature gradient is WNL. Skin turgor WNL  Sensorium: Normal Semmes Weinstein monofilament test. Normal tactile sensation bilaterally. Nail Exam: Pt has thick disfigured discolored nails with subungual debris noted bilateral entire nail hallux through fifth toenails.  Pain on palpation to the nails. Ulcer Exam: There is no evidence of ulcer or pre-ulcerative changes or infection. Orthopedic Exam: Muscle tone and strength are WNL. No limitations in general ROM. No crepitus or effusions noted. HAV  B/L.  Hammer toes 2-5  B/L. Skin: No Porokeratosis. No infection or ulcers    Assessment & Plan:   1. Pain due to onychomycosis of toenails of both feet   2. Controlled type 2 diabetes mellitus without complication, with long-term current use of insulin (HCC)     Patient was evaluated and treated and all questions answered.  Onychomycosis with pain  -Nails palliatively debrided as below. -Educated on self-care  Procedure: Nail Debridement Rationale: pain  Type of Debridement: manual, sharp debridement. Instrumentation: Nail nipper, rotary burr. Number of Nails: 10  Procedures and Treatment: Consent by patient was obtained for treatment procedures. The patient understood the  discussion of treatment and procedures well. All questions were answered thoroughly reviewed. Debridement of mycotic and hypertrophic toenails, 1 through 5 bilateral and clearing of subungual debris. No ulceration, no infection noted.  Return Visit-Office Procedure: Patient instructed to return to the office for a follow up visit 3 months for continued evaluation and treatment.  Nicholes Rough, DPM    No follow-ups on file.

## 2021-02-07 ENCOUNTER — Ambulatory Visit: Payer: Non-veteran care | Admitting: Podiatry

## 2022-01-06 ENCOUNTER — Encounter: Payer: Self-pay | Admitting: Certified Registered Nurse Anesthetist

## 2022-01-06 ENCOUNTER — Emergency Department: Payer: No Typology Code available for payment source

## 2022-01-06 ENCOUNTER — Inpatient Hospital Stay: Payer: No Typology Code available for payment source

## 2022-01-06 ENCOUNTER — Encounter: Admission: EM | Disposition: E | Payer: Self-pay | Source: Home / Self Care | Attending: Pulmonary Disease

## 2022-01-06 ENCOUNTER — Encounter: Payer: Self-pay | Admitting: Emergency Medicine

## 2022-01-06 ENCOUNTER — Inpatient Hospital Stay
Admission: EM | Admit: 2022-01-06 | Discharge: 2022-01-26 | DRG: 299 | Disposition: E | Payer: No Typology Code available for payment source | Attending: Pulmonary Disease | Admitting: Pulmonary Disease

## 2022-01-06 ENCOUNTER — Other Ambulatory Visit: Payer: Self-pay

## 2022-01-06 DIAGNOSIS — Z7984 Long term (current) use of oral hypoglycemic drugs: Secondary | ICD-10-CM | POA: Diagnosis not present

## 2022-01-06 DIAGNOSIS — E119 Type 2 diabetes mellitus without complications: Secondary | ICD-10-CM | POA: Diagnosis present

## 2022-01-06 DIAGNOSIS — R578 Other shock: Secondary | ICD-10-CM | POA: Insufficient documentation

## 2022-01-06 DIAGNOSIS — G9341 Metabolic encephalopathy: Secondary | ICD-10-CM | POA: Diagnosis present

## 2022-01-06 DIAGNOSIS — Z79899 Other long term (current) drug therapy: Secondary | ICD-10-CM | POA: Diagnosis not present

## 2022-01-06 DIAGNOSIS — I7133 Infrarenal abdominal aortic aneurysm, ruptured: Principal | ICD-10-CM | POA: Diagnosis present

## 2022-01-06 DIAGNOSIS — R579 Shock, unspecified: Secondary | ICD-10-CM

## 2022-01-06 DIAGNOSIS — Z888 Allergy status to other drugs, medicaments and biological substances status: Secondary | ICD-10-CM | POA: Diagnosis not present

## 2022-01-06 DIAGNOSIS — I713 Abdominal aortic aneurysm, ruptured, unspecified: Secondary | ICD-10-CM | POA: Diagnosis present

## 2022-01-06 DIAGNOSIS — J9601 Acute respiratory failure with hypoxia: Secondary | ICD-10-CM | POA: Diagnosis present

## 2022-01-06 DIAGNOSIS — F17210 Nicotine dependence, cigarettes, uncomplicated: Secondary | ICD-10-CM | POA: Diagnosis present

## 2022-01-06 DIAGNOSIS — R1031 Right lower quadrant pain: Secondary | ICD-10-CM | POA: Diagnosis present

## 2022-01-06 DIAGNOSIS — I452 Bifascicular block: Secondary | ICD-10-CM | POA: Diagnosis present

## 2022-01-06 DIAGNOSIS — I469 Cardiac arrest, cause unspecified: Secondary | ICD-10-CM | POA: Diagnosis not present

## 2022-01-06 DIAGNOSIS — Z66 Do not resuscitate: Secondary | ICD-10-CM | POA: Diagnosis present

## 2022-01-06 DIAGNOSIS — Z515 Encounter for palliative care: Secondary | ICD-10-CM | POA: Diagnosis not present

## 2022-01-06 DIAGNOSIS — K5900 Constipation, unspecified: Secondary | ICD-10-CM | POA: Diagnosis present

## 2022-01-06 LAB — COMPREHENSIVE METABOLIC PANEL
ALT: 13 U/L (ref 0–44)
AST: 27 U/L (ref 15–41)
Albumin: 3.6 g/dL (ref 3.5–5.0)
Alkaline Phosphatase: 70 U/L (ref 38–126)
Anion gap: 14 (ref 5–15)
BUN: 22 mg/dL (ref 8–23)
CO2: 20 mmol/L — ABNORMAL LOW (ref 22–32)
Calcium: 9.3 mg/dL (ref 8.9–10.3)
Chloride: 100 mmol/L (ref 98–111)
Creatinine, Ser: 1.75 mg/dL — ABNORMAL HIGH (ref 0.61–1.24)
GFR, Estimated: 42 mL/min — ABNORMAL LOW (ref >60)
Glucose, Bld: 223 mg/dL — ABNORMAL HIGH (ref 70–99)
Potassium: 4.7 mmol/L (ref 3.5–5.1)
Sodium: 134 mmol/L — ABNORMAL LOW (ref 135–145)
Total Bilirubin: 1.6 mg/dL — ABNORMAL HIGH (ref 0.3–1.2)
Total Protein: 7.2 g/dL (ref 6.5–8.1)

## 2022-01-06 LAB — PREPARE RBC (CROSSMATCH)

## 2022-01-06 LAB — CBC
HCT: 36.4 % — ABNORMAL LOW (ref 39.0–52.0)
Hemoglobin: 12.3 g/dL — ABNORMAL LOW (ref 13.0–17.0)
MCH: 28.8 pg (ref 26.0–34.0)
MCHC: 33.8 g/dL (ref 30.0–36.0)
MCV: 85.2 fL (ref 80.0–100.0)
Platelets: 267 10*3/uL (ref 150–400)
RBC: 4.27 MIL/uL (ref 4.22–5.81)
RDW: 13.4 % (ref 11.5–15.5)
WBC: 14.1 10*3/uL — ABNORMAL HIGH (ref 4.0–10.5)
nRBC: 0 % (ref 0.0–0.2)

## 2022-01-06 LAB — LIPASE, BLOOD: Lipase: 31 U/L (ref 11–51)

## 2022-01-06 LAB — MASSIVE TRANSFUSION PROTOCOL ORDER (BLOOD BANK NOTIFICATION)

## 2022-01-06 SURGERY — Surgical Case

## 2022-01-06 MED ORDER — FENTANYL CITRATE (PF) 100 MCG/2ML IJ SOLN
INTRAMUSCULAR | Status: AC | PRN
  Administered 2022-01-06: 100 ug via INTRAVENOUS

## 2022-01-06 MED ORDER — NOREPINEPHRINE 4 MG/250ML-% IV SOLN
INTRAVENOUS | Status: AC
  Filled 2022-01-06: qty 500

## 2022-01-06 MED ORDER — POLYETHYLENE GLYCOL 3350 17 G PO PACK: 17.0000 g | PACK | Freq: Every day | ORAL | Status: DC

## 2022-01-06 MED ORDER — DOCUSATE SODIUM 50 MG/5ML PO LIQD: 100.0000 mg | Freq: Two times a day (BID) | ORAL | Status: DC

## 2022-01-06 MED ORDER — ROCURONIUM BROMIDE 10 MG/ML (PF) SYRINGE
PREFILLED_SYRINGE | INTRAVENOUS | Status: AC
  Filled 2022-01-06: qty 10

## 2022-01-06 MED ORDER — ETOMIDATE 2 MG/ML IV SOLN
INTRAVENOUS | Status: AC | PRN
  Administered 2022-01-06: 20 mg via INTRAVENOUS

## 2022-01-06 MED ORDER — SODIUM BICARBONATE 8.4 % IV SOLN
INTRAVENOUS | Status: AC | PRN
  Administered 2022-01-06: 50 meq via INTRAVENOUS

## 2022-01-06 MED ORDER — FENTANYL 2500MCG IN NS 250ML (10MCG/ML) PREMIX INFUSION: 25.0000 ug/h | INTRAVENOUS | Status: DC

## 2022-01-06 MED ORDER — LACTATED RINGERS IV BOLUS
1000.0000 mL | Freq: Once | INTRAVENOUS | Status: AC
  Administered 2022-01-06: 1000 mL via INTRAVENOUS

## 2022-01-06 MED ORDER — CALCIUM CHLORIDE 10 % IV SOLN
INTRAVENOUS | Status: AC | PRN
  Administered 2022-01-06 (×2): 1 g via INTRAVENOUS

## 2022-01-06 MED ORDER — SODIUM CHLORIDE 0.9 % IV BOLUS
1000.0000 mL | Freq: Once | INTRAVENOUS | Status: AC
  Administered 2022-01-06: 1000 mL via INTRAVENOUS

## 2022-01-06 MED ORDER — EPINEPHRINE 1 MG/10ML IJ SOSY
PREFILLED_SYRINGE | INTRAMUSCULAR | Status: AC | PRN
  Administered 2022-01-06 (×5): 1 mg via INTRAVENOUS

## 2022-01-06 MED ORDER — FENTANYL BOLUS VIA INFUSION: 25.0000 ug | INTRAVENOUS | Status: DC | PRN

## 2022-01-06 MED ORDER — ROCURONIUM BROMIDE 50 MG/5ML IV SOLN
INTRAVENOUS | Status: AC | PRN
  Administered 2022-01-06: 100 mg via INTRAVENOUS

## 2022-01-06 MED ORDER — FENTANYL CITRATE PF 50 MCG/ML IJ SOSY
PREFILLED_SYRINGE | INTRAMUSCULAR | Status: AC
  Filled 2022-01-06: qty 2

## 2022-01-06 MED ORDER — FENTANYL CITRATE PF 50 MCG/ML IJ SOSY: 25.0000 ug | PREFILLED_SYRINGE | Freq: Once | INTRAMUSCULAR | Status: DC

## 2022-01-06 MED ORDER — DOCUSATE SODIUM 100 MG PO CAPS: 100.0000 mg | ORAL_CAPSULE | Freq: Two times a day (BID) | ORAL | Status: DC | PRN

## 2022-01-06 MED ORDER — NOREPINEPHRINE 4 MG/250ML-% IV SOLN
INTRAVENOUS | Status: AC | PRN
  Administered 2022-01-06: 12 ug/min via INTRAVENOUS

## 2022-01-06 MED ORDER — PHENYLEPHRINE HCL-NACL 20-0.9 MG/250ML-% IV SOLN
INTRAVENOUS | Status: AC
  Filled 2022-01-06: qty 250

## 2022-01-06 MED ORDER — ETOMIDATE 2 MG/ML IV SOLN
INTRAVENOUS | Status: AC
  Filled 2022-01-06: qty 20

## 2022-01-06 MED ORDER — POLYETHYLENE GLYCOL 3350 17 G PO PACK: 17.0000 g | PACK | Freq: Every day | ORAL | Status: DC | PRN

## 2022-01-06 MED ORDER — ATROPINE SULFATE 0.4 MG/ML IV SOLN
INTRAVENOUS | Status: AC
  Filled 2022-01-06: qty 1

## 2022-01-06 MED ORDER — EPINEPHRINE 1 MG/10ML IJ SOSY
PREFILLED_SYRINGE | INTRAMUSCULAR | Status: AC | PRN
  Administered 2022-01-06: 1 mg via INTRAVENOUS

## 2022-01-06 MED ORDER — IOHEXOL 350 MG/ML SOLN
100.0000 mL | Freq: Once | INTRAVENOUS | Status: AC | PRN
  Administered 2022-01-06: 100 mL via INTRAVENOUS

## 2022-01-06 MED ORDER — CALCIUM GLUCONATE 10 % IV SOLN
INTRAVENOUS | Status: AC
  Filled 2022-01-06: qty 20

## 2022-01-08 LAB — BPAM FFP
Blood Product Expiration Date: 202311142359
Blood Product Expiration Date: 202311162359
Blood Product Expiration Date: 202311162359
Blood Product Expiration Date: 202311162359
ISSUE DATE / TIME: 202311091334
Unit Type and Rh: 5100
Unit Type and Rh: 5100
Unit Type and Rh: 6200
Unit Type and Rh: 6200

## 2022-01-08 LAB — PREPARE PLATELET PHERESIS: Unit division: 0

## 2022-01-08 LAB — PREPARE FRESH FROZEN PLASMA
Unit division: 0
Unit division: 0

## 2022-01-08 LAB — BPAM PLATELET PHERESIS
Blood Product Expiration Date: 202311112359
Unit Type and Rh: 6200

## 2022-01-09 LAB — BPAM RBC
Blood Product Expiration Date: 202312052359
Blood Product Expiration Date: 202312052359
Blood Product Expiration Date: 202312062359
Blood Product Expiration Date: 202312062359
Blood Product Expiration Date: 202312102359
Blood Product Expiration Date: 202312112359
Blood Product Expiration Date: 202312142359
Blood Product Expiration Date: 202312142359
Blood Product Expiration Date: 202312152359
Blood Product Expiration Date: 202312152359
Blood Product Expiration Date: 202312152359
Blood Product Expiration Date: 202312152359
Blood Product Expiration Date: 202312152359
Blood Product Expiration Date: 202312152359
ISSUE DATE / TIME: 202311111509
ISSUE DATE / TIME: 202311111509
ISSUE DATE / TIME: 202311111623
ISSUE DATE / TIME: 202311111623
ISSUE DATE / TIME: 202311111623
ISSUE DATE / TIME: 202311111623
ISSUE DATE / TIME: 202311111630
ISSUE DATE / TIME: 202311111630
ISSUE DATE / TIME: 202311111630
ISSUE DATE / TIME: 202311111630
ISSUE DATE / TIME: 202311121018
ISSUE DATE / TIME: 202311130053
ISSUE DATE / TIME: 202311130810
Unit Type and Rh: 5100
Unit Type and Rh: 5100
Unit Type and Rh: 5100
Unit Type and Rh: 5100
Unit Type and Rh: 5100
Unit Type and Rh: 5100
Unit Type and Rh: 5100
Unit Type and Rh: 5100
Unit Type and Rh: 5100
Unit Type and Rh: 5100
Unit Type and Rh: 5100
Unit Type and Rh: 5100
Unit Type and Rh: 5100
Unit Type and Rh: 5100

## 2022-01-09 LAB — TYPE AND SCREEN
ABO/RH(D): O POS
Antibody Screen: NEGATIVE
Unit division: 0
Unit division: 0
Unit division: 0
Unit division: 0
Unit division: 0
Unit division: 0
Unit division: 0
Unit division: 0
Unit division: 0
Unit division: 0
Unit division: 0
Unit division: 0
Unit division: 0
Unit division: 0

## 2022-01-26 NOTE — Code Documentation (Signed)
DNR recorded/ ordered in [facility]

## 2022-01-26 NOTE — Code Documentation (Signed)
No pulse, CPR continues

## 2022-01-26 NOTE — ED Notes (Addendum)
2nd unit PRBC infusing on pressure bag to R AC, see green paper tag.

## 2022-01-26 NOTE — Sedation Documentation (Signed)
No pulses. Confirmed by 2nd and 3rd RN, Code initiated, Compressions started. PCCM D[facility] See at [facility].

## 2022-01-26 NOTE — Sedation Documentation (Signed)
CPR continues.  

## 2022-01-26 NOTE — Code Documentation (Signed)
Pulse check, pulseless, asystole, CPR continues

## 2022-01-26 NOTE — Consult Note (Signed)
PHARMACY CONSULT NOTE - FOLLOW UP  Pharmacy Consult for Electrolyte Monitoring and Replacement   Recent Labs: Potassium (mmol/L)  Date Value  Feb 01, 2022 4.7  01/31/2012 3.6   Calcium (mg/dL)  Date Value  58/10/9831 9.3   Calcium, Total (mg/dL)  Date Value  82/50/5397 8.7   Albumin (g/dL)  Date Value  67/34/1937 3.6  01/31/2012 3.5   Sodium (mmol/L)  Date Value  02/01/2022 134 (L)  01/31/2012 137     Assessment: 71 yo male who presented to [facility] ER on 11/11 via EMS with c/o right lower back and abdominal pain onset of symptoms. PT has hx of AAA measuring 5.5 cm 3 yrs ago which is being followed by vascular surgery. TA Chest/Abd/Pel for Dissection revealed ruptured infrarenal abdominal aortic aneurysm with large retroperitoneal hematoma and active retroperitoneal hemorrhage. Pharmacy has been consulted for electrolyte monitoring and replacement as needed.    Goal of Therapy:  Electrolytes  WNL  Plan:  No additional electrolyte replacement needed at this time Follow up with with AM labs  Sharen Hones, PharmD, BCPS Clinical Pharmacist   Feb 01, 2022 4:02 PM

## 2022-01-26 NOTE — Code Documentation (Signed)
ROSC, Pulse 120, HR 120

## 2022-01-26 NOTE — ED Provider Notes (Addendum)
[facility] Provider Note    Event Date/Time   First MD Initiated Contact with Patient 01/08/22 1439     (approximate)   History   Abdominal Pain   HPI  Scott Riddle is a 71 y.o. male with history of known aneurysm who comes in with concerns for abdominal discomfort.  He reports 36 hours of vague abdominal discomfort up until a few hours ago he reports significantly more pain feeling like he might of popped something with bearing down after using some laxatives.  He denies any chest pain, shortness of breath.  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 Reports he has followed up with vascular surgery and no interventions were needed per [place].  Last check was 6 months ago.   Physical Exam   Triage Vital Signs: ED Triage Vitals  Enc Vitals Group     BP 2022/01/08 1427 (!) 88/63     Pulse Rate Jan 08, 2022 1427 (!) 56     Resp 2022-01-08 1427 20     Temp January 08, 2022 1427 97.8 F (36.6 C)     Temp Source 01/08/2022 1427 Oral     SpO2 01/08/2022 1427 97 %     Weight 01-08-2022 1425 270 lb (122.5 kg)     Height 01/08/22 1425 5\' 9"  (1.753 m)     Head Circumference --      Peak Flow --      Pain Score 2022/01/08 1424 8     Pain Loc --      Pain Edu? --      Excl. in GC? --     Most recent vital signs: Vitals:   2022/01/08 1427  BP: (!) 88/63  Pulse: (!) 56  Resp: 20  Temp: 97.8 F (36.6 C)  SpO2: 97%     General: Awake, no distress.  CV:  Good peripheral perfusion.  Resp:  Normal effort.  Abd:  No distention.  Tender abdomen Other:     ED Results / Procedures / Treatments   Labs (all labs ordered are listed, but only abnormal results are displayed) Labs Reviewed  CBC - Abnormal; Notable for the following components:      Result Value   WBC 14.1 (*)    Hemoglobin 12.3 (*)    HCT 36.4 (*)    All other components within normal limits  LIPASE, BLOOD  COMPREHENSIVE METABOLIC PANEL  URINALYSIS, ROUTINE W  REFLEX MICROSCOPIC  TYPE AND SCREEN     EKG  My interpretation of EKG:  Sinus rate of 57 without any ST elevation or T wave inversions with right bundle branch block and left anterior fascicular block  RADIOLOGY I have reviewed the CT personally and interpreted and patient is actively having a aneurysm rupture.   PROCEDURES:  Critical Care performed: Yes, see critical care procedure note(s)  .1-3 Lead EKG Interpretation  Performed by: 13/11/23, MD Authorized by: Concha Se, MD     Interpretation: abnormal     ECG rate:  50   ECG rate assessment: normal     Rhythm: sinus rhythm     Ectopy: none     Conduction: normal   .Critical Care  Performed by: Concha Se, MD Authorized by: Concha Se, MD   Critical care provider statement:    Critical care time (minutes):  75   Critical care was necessary to treat or prevent imminent or life-threatening deterioration of  the following conditions: AAA rupture.   Critical care was time spent personally by me on the following activities:  Development of treatment plan with patient or surrogate, discussions with consultants, evaluation of patient's response to treatment, examination of patient, ordering and review of laboratory studies, ordering and review of radiographic studies, ordering and performing treatments and interventions, pulse oximetry, re-evaluation of patient's condition and review of old charts    MEDICATIONS ORDERED IN ED: Medications  sodium chloride 0.9 % bolus 1,000 mL (has no administration in time range)     IMPRESSION / MDM / ASSESSMENT AND PLAN / ED COURSE  I reviewed the triage vital signs and the nursing notes.   Patient's presentation is most consistent with acute presentation with potential threat to life or bodily function.   Patient comes in with acute abdominal discomfort initially hypotensive in triage but on recheck with me patient is normotensive.  I am concerned about aneurysm rupture  I have called CT to get stat CT scan given currently normotensive.  I discussed with patient the risk of proceeding without kidney function the risk of dialysis but knowing that this could be life-threatening patient is willing to proceed with CT scan.   While in CT pt in hypotensive getting 1L fluid while in CT   Given confirmed hypotension I have done  2 L of emergency release  blood and I have called vascular to give them a heads up given high concern for rupture although pt is still in CT scanner so no images yet to review.   Pt back from CT- blood is infusing. Waiting on images to cross over.   I reviewed the CT personally and saw it had ruptured.   Patient CT scan does show ruptured aneurysm.  I have discussed with vascular s Dr.  [name] is aware and is already on her way here given I had called her ealier.  Does not recommend any TXA.  Recommends permissive hypotension but given patient is very pale we will start off with 2 units of blood  CBC shows hemoglobin of 12.3.  Lipase normal CMP shows elevated creatinine.  Family is at bedside the patient's son which I have explained to him the there is a high risk for morbidity they expressed understanding but they would like to proceed with surgery  Case discussed with ICU they are at bedside now.    The patient is on the cardiac monitor to evaluate for evidence of arrhythmia and/or significant heart rate changes.      FINAL CLINICAL IMPRESSION(S) / ED DIAGNOSES   Final diagnoses:  Ruptured abdominal aortic aneurysm (AAA), unspecified part (HCC)  Shock (HCC)     Rx / DC Orders   ED Discharge Orders     None        Note:  This document was prepared using Dragon voice recognition software and may include unintentional dictation errors.   Concha Se, MD 2022-01-12 1542    Concha Se, MD 01/12/2022 1558    Concha Se, MD 01/12/22 [phone]

## 2022-01-26 NOTE — Death Summary Note (Signed)
DEATH SUMMARY   Patient Details  Name: Scott Riddle MRN: [id number] DOB: [birth year]  Admission/Discharge Information   Admit Date:  14-Jan-2022  Date of Death:  2022/01/14  Time of Death:  17:34  Length of Stay: 0  Referring Physician: [facility]   Reason(s) for Hospitalization  Back and Abdominal Pain   Diagnoses  Preliminary cause of death: Ruptured abdominal aortic aneurysm (AAA) (HCC) Secondary Diagnoses (including complications and co-morbidities):  Principal Problem:   Ruptured abdominal aortic aneurysm (AAA) (HCC) Active Problems:   Acute hypoxic respiratory failure (HCC)   Metabolic encephalopathy   Hemorrhagic shock [facility])   Brief Hospital Course (including significant findings, care, treatment, and services provided and events leading to death)  Scott Riddle is a 71 y.o. year old male who presented to [facility] ER on 01-14-2022 via EMS with c/o right lower back and abdominal pain onset of symptoms 01-14-22.  Pt also endorsed a 3 day hx of constipation.  Per EMS run sheet the pt reported he took miralax and senna without relief of symptoms.  He has a hx of 3 yr hx of AAA measuring 5.5 cm which is being followed by vascular surgery at the [place] with last check 6 months ago no surgical interventions recommended at that time.    ED Course  Upon arrival to the ER pt c/o of acute abdominal pain and initially hypotensive during triage, but became normotensive.  CTA Chest/Abd/Pel for Dissection revealed ruptured infrarenal abdominal aortic aneurysm with large retroperitoneal hematoma and active retroperitoneal hemorrhage.  Vascular Surgeon Dr. [name] contacted by ER physician who did not recommend TXA, but recommended permissive hypotension.  Dr. [name] to proceed to the ER to evaluate pt.  Pt also received 2 units of pRBC's.  PCCM team contacted by ER provider for ICU admission.  Pt severely encephalopathic and in acute respiratory failure requiring mechanical intubation by  PCCM team in the ER.  Following mechanical intubation pt PEA arrested.  ACLS protocol initiated and Samuel Bouche applied.  Massive blood transfusion protocol initiated.  ROSC achieved following 33 minutes of ACLS.  Pts son informed by Vascular Surgeon Dr. [name] unable to proceed with surgical procedure at this time due to unstable condition and prognosis grave.  Pts son changed code status to DNR with no escalation of care.     Immediately upon arrival to ICU pt asystole on cardiac monitor and pulseless.  Pt expired at 17:34 on 01/14/2022.  Pts son at bedside   Pertinent Labs and Studies  Significant Diagnostic Studies CT Angio Chest/Abd/Pel for Dissection W and/or W/WO  Result Date: 01-14-2022 CLINICAL DATA:  Larey Seat, diaphoretic, right lower quadrant pain, history of abdominal aortic aneurysm EXAM: CT ANGIOGRAPHY CHEST, ABDOMEN AND PELVIS TECHNIQUE: Non-contrast CT of the chest was initially obtained. Multidetector CT imaging through the chest, abdomen and pelvis was performed using the standard protocol during bolus administration of intravenous contrast. Multiplanar reconstructed images and MIPs were obtained and reviewed to evaluate the vascular anatomy. RADIATION DOSE REDUCTION: This exam was performed according to the departmental dose-optimization program which includes automated exposure control, adjustment of the mA and/or kV according to patient size and/or use of iterative reconstruction technique. CONTRAST:  OMNIPAQUE IOHEXOL 350 MG/ML SOLN COMPARISON:  None Available. FINDINGS: CTA CHEST FINDINGS Cardiovascular: The heart is unremarkable without pericardial effusion. No evidence of thoracic aortic aneurysm or dissection. Atherosclerosis of the aortic arch. There is technically adequate evaluation of the pulmonary vasculature. No filling defects or pulmonary emboli. Mediastinum/Nodes: No  enlarged mediastinal, hilar, or axillary lymph nodes. Thyroid gland, trachea, and esophagus demonstrate no  significant findings. Lungs/Pleura: No acute airspace disease, effusion, or pneumothorax. Central airways are patent. Musculoskeletal: No acute or destructive bony lesions. Reconstructed images demonstrate no additional findings. Review of the MIP images confirms the above findings. CTA ABDOMEN AND PELVIS FINDINGS VASCULAR Aorta: There is an infrarenal abdominal aortic aneurysm, measuring 7.4 x 6.7 cm in transverse dimension and extending approximately 12.4 cm in craniocaudal length. There is active contrast extravasation arising from the right posterolateral aspect of the aorta, reference image 85/6, consistent with ruptured aneurysm. There is a large retroperitoneal hematoma, with extravasation of contrast into the right psoas musculature. Emergent vascular surgery consultation recommended. Celiac: Patent without evidence of aneurysm, dissection, vasculitis or significant stenosis. SMA: Patent without evidence of aneurysm, dissection, vasculitis or significant stenosis. Renals: Both renal arteries are patent. Mild atherosclerosis at the origin of the renal arteries without critical stenosis. No evidence of aneurysm, dissection, or vasculitis. IMA: The origin of the IMA is not well visualized off the aortic aneurysm. The distal IMA is opacified consistent with collateral flow. Inflow: Bilateral common iliac artery aneurysms, measuring 1.9 cm on the right and 1.8 cm on the left. Diffuse atherosclerosis, with high-grade stenosis at the origin of the bilateral common iliac arteries right greater than left. Diffuse atherosclerosis throughout the external iliac, common femoral, and visualized portion of the superficial femoral distributions. Veins: No obvious venous abnormality within the limitations of this arterial phase study. Review of the MIP images confirms the above findings. NON-VASCULAR Hepatobiliary: Hepatic steatosis. No focal parenchymal abnormality. The gallbladder is unremarkable. Pancreas: Unremarkable.  No pancreatic ductal dilatation or surrounding inflammatory changes. Spleen: Normal in size without focal abnormality. Adrenals/Urinary Tract: Adrenal glands are unremarkable. Kidneys are normal, without renal calculi, focal lesion, or hydronephrosis. Bladder is unremarkable. Stomach/Bowel: No bowel obstruction or ileus. No bowel wall thickening or inflammatory change. Lymphatic: No pathologic adenopathy within the abdomen or pelvis. Reproductive: Prostate is unremarkable. Other: Large retroperitoneal hematoma and active arterial hemorrhage from ruptured aneurysm as above. There is no free intraperitoneal fluid or free intraperitoneal gas. No abdominal wall hernia. Musculoskeletal: No acute or destructive bony lesions. Postsurgical changes from L4-5 and L5-S1 discectomy and posterior fusion. Reconstructed images demonstrate no additional findings. Review of the MIP images confirms the above findings. IMPRESSION: 1. Ruptured infrarenal abdominal aortic aneurysm as above, measuring approximately 7.4 x 6.7 cm in transverse dimension and extending 12.4 cm in craniocaudal length. Large retroperitoneal hematoma and extravasation of arterial contrast consistent with active retroperitoneal hemorrhage. Emergent vascular surgery consultation recommended. 2. Hepatic steatosis. 3.  Aortic Atherosclerosis (ICD10-I70.0). Critical Value/emergent results were called by telephone at the time of interpretation on 2022-01-15 at 3:26 pm to provider [facility] , who verbally acknowledged these results. Electronically Signed   By: Sharlet Salina [name]   On: 01-15-22 15:40    Microbiology No results found for this or any previous visit (from the past 240 hour(s)).  Lab Basic Metabolic Panel: Recent Labs  Lab 2022/01/15 1431  NA 134*  K 4.7  CL 100  CO2 20*  GLUCOSE 223*  BUN 22  CREATININE 1.75*  CALCIUM 9.3   Liver Function Tests: Recent Labs  Lab 15-Jan-2022 1431  AST 27  ALT 13  ALKPHOS 70  BILITOT 1.6*  PROT 7.2   ALBUMIN 3.6   Recent Labs  Lab 15-Jan-2022 1431  LIPASE 31   No results for input(s): "AMMONIA" in the last 168 hours. CBC: Recent  Labs  Lab Jan 20, 2022 1431  WBC 14.1*  HGB 12.3*  HCT 36.4*  MCV 85.2  PLT 267   Cardiac Enzymes: No results for input(s): "CKTOTAL", "CKMB", "CKMBINDEX", "TROPONINI" in the last 168 hours. Sepsis Labs: Recent Labs  Lab January 20, 2022 1431  WBC 14.1*    Procedures/Operations  Mechanical Intubation  Left Femoral Central Line Placement   Zada Girt, [place]  Pulmonary/Critical Care Pager [phone] (please enter 7 digits) PCCM Consult Pager [phone] (please enter 7 digits)

## 2022-01-26 NOTE — Code Documentation (Signed)
LUCAS continues, Dr. Karna Christmas PCCM initiating L femoral central line

## 2022-01-26 NOTE — ED Notes (Signed)
Son at [facility]. PCCM at [facility].

## 2022-01-26 NOTE — Code Documentation (Signed)
Levo changed to L femoral central line, LUCAS continues

## 2022-01-26 NOTE — Code Documentation (Signed)
2 units PRBCs #9 & #10 hung/ infusing. Dr. [name] surgeon into room after she spoke with son who verbalized "does not want to continue efforts", discussed amongst team. Will not start CPR if heart stops, pressors stopped. Blood hung continues to infuse.

## 2022-01-26 NOTE — ED Notes (Signed)
In to CT, SBP 115, no changes, GCS 15, LR bolus infusing

## 2022-01-26 NOTE — Progress Notes (Signed)
Received patient from ED and upon arrival to floor patient went into asystole, care team at bedside and NP retrieved son from waiting room, death was confirmed via auscultation of the heart for one full minute by myself and charge nurse at [phone].  Patient's son at bedside and requested to be along with his father.  Request honored.  Patient's son came to the nurses station and desires for patient's body to be sent to [facility] on [address].  [facility] contacted [facility].  Son obtained all of patient's belongings and patient with no personal effects.

## 2022-01-26 NOTE — Code Documentation (Signed)
HR 119, pulses present

## 2022-01-26 NOTE — ED Notes (Signed)
Restless, crawling onto R side. Son sent to w/r. Preparing to intubate, and place central line and A-line. Pt non-purposefully removing self from monitor. Pending OR.

## 2022-01-26 NOTE — Code Documentation (Signed)
3 Units PRBC emergency release blood hung/ infusing (MTP), Units 3,4 & 5

## 2022-01-26 NOTE — Sedation Documentation (Signed)
26cm lip

## 2022-01-26 NOTE — ED Notes (Signed)
FIRST NURSE: Pt to ER via EMS with c/o right lower back pain starting today.  Pt c/o constipation for last 3 days.  125/71, 54, 98% BS 182

## 2022-01-26 NOTE — Code Documentation (Signed)
HR 119

## 2022-01-26 NOTE — Code Documentation (Signed)
Scott Riddle removed. PCCM present.

## 2022-01-26 NOTE — Code Documentation (Signed)
Doppler, no pulse, PEA, LUCAS continues

## 2022-01-26 NOTE — ED Notes (Signed)
Consents for central line and intubation signed by son, paper, on/ with chart.

## 2022-01-26 NOTE — ED Notes (Signed)
PCCM and EDP at [facility]. Son remains at [facility]. Attempting 3rd vasc access. Preparing for central line. GCS 15.

## 2022-01-26 NOTE — Consult Note (Signed)
Reason for Consult:Ruptured AAA Referring Physician: Dr. Isidoro DonningFunke  Scott Riddle is an 71 y.o. male.  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 Upon my arrival patient confused and hypoxic- being intubated. Per son patient was Advised 3-4 years ago to have repaired and he refused. Presented today with 1-2 days of abdominal pain worsened today. 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 No pertinent surgical history.  No family history on file.  Social History:  reports that he has been smoking cigarettes. He has been smoking an average of 1 pack per day. He has never used smokeless tobacco. He reports that he does not drink alcohol and does not use drugs.  Allergies:  Allergies  Allergen Reactions   Neurontin [Gabapentin]    Plavix [Clopidogrel Bisulfate]    Duloxetine Anxiety    Medications: I have reviewed the patient's current medications.  Results for orders placed or performed during the hospital encounter of January 07, 2022 (from the past 48 hour(s))  Lipase, blood     Status: None   Collection Time: January 07, 2022  2:31 PM  Result Value Ref Range   Lipase 31 11 - 51 U/L    Comment: Performed at [facility], [address] [place], [place] [address]  Comprehensive metabolic panel     Status: Abnormal   Collection Time: January 07, 2022  2:31 PM  Result Value Ref Range   Sodium 134 (L) 135 - 145 mmol/L   Potassium 4.7 3.5 - 5.1 mmol/L    Comment: HEMOLYSIS AT THIS LEVEL MAY AFFECT RESULT   Chloride 100 98 - 111 mmol/L   CO2 20 (L) 22 - 32 mmol/L   Glucose, Bld 223 (H) 70 - 99 mg/dL    Comment: Glucose reference range applies only to samples taken after fasting for at least 8 hours.   BUN 22 8 - 23 mg/dL    Creatinine, Ser 0.981.75 (H) 0.61 - 1.24 mg/dL   Calcium 9.3 8.9 - 11.910.3 mg/dL   Total Protein 7.2 6.5 - 8.1 g/dL   Albumin 3.6 3.5 - 5.0 g/dL   AST 27 15 - 41 U/L    Comment: HEMOLYSIS AT THIS LEVEL MAY AFFECT RESULT   ALT 13 0 - 44 U/L    Comment: HEMOLYSIS AT THIS LEVEL MAY AFFECT RESULT   Alkaline Phosphatase 70 38 - 126 U/L   Total Bilirubin 1.6 (H) 0.3 - 1.2 mg/dL    Comment: HEMOLYSIS AT THIS LEVEL MAY AFFECT RESULT   GFR, Estimated 42 (L) >60 mL/min    Comment: (NOTE) Calculated using the CKD-EPI Creatinine Equation (2021)    Anion gap 14 5 - 15    Comment: Performed at [facility], [address] [place], [place] [address]  CBC     Status: Abnormal   Collection Time: January 07, 2022  2:31 PM  Result Value Ref Range   WBC 14.1 (H) 4.0 - 10.5 K/uL   RBC 4.27 4.22 - 5.81 MIL/uL   Hemoglobin 12.3 (L) 13.0 - 17.0 g/dL   HCT 95.636.4 (L) 21.339.0 - 08.652.0 %   MCV 85.2 80.0 - 100.0 fL   MCH 28.8 26.0 - 34.0 pg   MCHC 33.8 30.0 - 36.0  g/dL   RDW 13.4 11.5 - 15.5 %   Platelets 267 150 - 400 K/uL   nRBC 0.0 0.0 - 0.2 %    Comment: Performed at [facility], [address] [place], [place] [address]  Type and screen [place]     Status: None (Preliminary result)   Collection Time: 01-26-22  2:31 PM  Result Value Ref Range   ABO/RH(D) O POS    Antibody Screen NEG    Sample Expiration 01/09/2022,2359    Unit Number [id number]    Blood Component Type RED CELLS,LR    Unit division 00    Status of Unit ISSUED    Transfusion Status OK TO TRANSFUSE    Crossmatch Result COMPATIBLE    Unit Number [id number]    Blood Component Type RED CELLS,LR    Unit division 00    Status of Unit ISSUED    Transfusion Status OK TO TRANSFUSE    Crossmatch Result COMPATIBLE    Unit Number [id number]    Blood Component Type RED CELLS,LR    Unit division 00    Status of Unit ISSUED    Transfusion Status OK TO TRANSFUSE    Crossmatch Result Compatible    Unit  Number [id number]    Blood Component Type RED CELLS,LR    Unit division 00    Status of Unit ISSUED    Transfusion Status OK TO TRANSFUSE    Crossmatch Result Compatible    Unit Number [id number]    Blood Component Type RED CELLS,LR    Unit division 00    Status of Unit ISSUED    Transfusion Status OK TO TRANSFUSE    Crossmatch Result Compatible    Unit Number [id number]    Blood Component Type RED CELLS,LR    Unit division 00    Status of Unit ISSUED    Transfusion Status OK TO TRANSFUSE    Crossmatch Result Compatible    Unit Number [id number]    Blood Component Type RED CELLS,LR    Unit division 00    Status of Unit ISSUED    Transfusion Status OK TO TRANSFUSE    Crossmatch Result Compatible    Unit Number [id number]    Blood Component Type RED CELLS,LR    Unit division 00    Status of Unit ISSUED    Transfusion Status OK TO TRANSFUSE    Crossmatch Result Compatible    Unit Number [id number]    Blood Component Type RED CELLS,LR    Unit division 00    Status of Unit ISSUED    Transfusion Status OK TO TRANSFUSE    Crossmatch Result Compatible    Unit Number [id number]    Blood Component Type RED CELLS,LR    Unit division 00    Status of Unit ISSUED    Transfusion Status OK TO TRANSFUSE    Crossmatch Result Compatible    Unit Number [id number]    Blood Component Type RED CELLS,LR    Unit division 00    Status of Unit ALLOCATED    Transfusion Status OK TO TRANSFUSE    Crossmatch Result      Compatible Performed at [facility], [address] [place], [place] [address]    Unit Number [id number]    Blood Component Type RED CELLS,LR    Unit division 00    Status of Unit ALLOCATED    Transfusion Status OK TO TRANSFUSE    Crossmatch Result Compatible  Unit Number [id number]    Blood Component Type RED CELLS,LR    Unit division 00    Status of Unit ALLOCATED    Transfusion Status OK TO TRANSFUSE    Crossmatch Result  Compatible    Unit Number [id number]    Blood Component Type RED CELLS,LR    Unit division 00    Status of Unit ALLOCATED    Transfusion Status OK TO TRANSFUSE    Crossmatch Result Compatible   Prepare RBC (crossmatch)     Status: None   Collection Time: 2022/01/25  3:05 PM  Result Value Ref Range   Order Confirmation      ORDER PROCESSED BY BLOOD BANK Performed at [facility], [address] [place], [place] [address]   Initiate MTP (Blood Bank Notification)     Status: None   Collection Time: Jan 25, 2022  4:20 PM  Result Value Ref Range   Initiate Massive Transfusion Protocol      MTP STARTED 1624 VIA Toni Arthurs Performed at [facility], [address], [place], [place] [address]   Prepare fresh frozen plasma     Status: None (Preliminary result)   Collection Time: 2022-01-25  4:20 PM  Result Value Ref Range   Unit Number [id number]    Blood Component Type THAWED PLASMA    Unit division 00    Status of Unit ALLOCATED    Transfusion Status OK TO TRANSFUSE    Unit Number [id number]    Blood Component Type THW PLS APHR    Unit division B0    Status of Unit ALLOCATED    Transfusion Status OK TO TRANSFUSE    Unit Number [id number]    Blood Component Type THW PLS APHR    Unit division B0    Status of Unit ALLOCATED    Transfusion Status OK TO TRANSFUSE    Unit Number [id number]    Blood Component Type THAWED PLASMA    Unit division 00    Status of Unit ALLOCATED    Transfusion Status OK TO TRANSFUSE   Prepare platelet pheresis     Status: None (Preliminary result)   Collection Time: 25-Jan-2022  4:50 PM  Result Value Ref Range   Unit Number [id number]    Blood Component Type PSORALEN TREATED    Unit division 00    Status of Unit ALLOCATED    Transfusion Status OK TO TRANSFUSE     CT Angio Chest/Abd/Pel for Dissection W and/or W/WO  Result Date: 2022-01-25 CLINICAL DATA:  [place], diaphoretic, right lower quadrant pain, history of  abdominal aortic aneurysm EXAM: CT ANGIOGRAPHY CHEST, ABDOMEN AND PELVIS TECHNIQUE: Non-contrast CT of the chest was initially obtained. Multidetector CT imaging through the chest, abdomen and pelvis was performed using the standard protocol during bolus administration of intravenous contrast. Multiplanar reconstructed images and MIPs were obtained and reviewed to evaluate the vascular anatomy. RADIATION DOSE REDUCTION: This exam was performed according to the departmental dose-optimization program which includes automated exposure control, adjustment of the mA and/or kV according to patient size and/or use of iterative reconstruction technique. CONTRAST:  145mL OMNIPAQUE IOHEXOL 350 MG/ML SOLN COMPARISON:  None Available. FINDINGS: CTA CHEST FINDINGS Cardiovascular: The heart is unremarkable without pericardial effusion. No evidence of thoracic aortic aneurysm or dissection. Atherosclerosis of the aortic arch. There is technically adequate evaluation of the pulmonary vasculature. No filling defects or pulmonary emboli. Mediastinum/Nodes: No enlarged mediastinal, hilar, or axillary lymph nodes. Thyroid gland, trachea, and esophagus demonstrate no significant findings. Lungs/Pleura:  No acute airspace disease, effusion, or pneumothorax. Central airways are patent. Musculoskeletal: No acute or destructive bony lesions. Reconstructed images demonstrate no additional findings. Review of the MIP images confirms the above findings. CTA ABDOMEN AND PELVIS FINDINGS VASCULAR Aorta: There is an infrarenal abdominal aortic aneurysm, measuring 7.4 x 6.7 cm in transverse dimension and extending approximately 12.4 cm in craniocaudal length. There is active contrast extravasation arising from the right posterolateral aspect of the aorta, reference image 85/6, consistent with ruptured aneurysm. There is a large retroperitoneal hematoma, with extravasation of contrast into the right psoas musculature. Emergent vascular surgery  consultation recommended. Celiac: Patent without evidence of aneurysm, dissection, vasculitis or significant stenosis. SMA: Patent without evidence of aneurysm, dissection, vasculitis or significant stenosis. Renals: Both renal arteries are patent. Mild atherosclerosis at the origin of the renal arteries without critical stenosis. No evidence of aneurysm, dissection, or vasculitis. IMA: The origin of the IMA is not well visualized off the aortic aneurysm. The distal IMA is opacified consistent with collateral flow. Inflow: Bilateral common iliac artery aneurysms, measuring 1.9 cm on the right and 1.8 cm on the left. Diffuse atherosclerosis, with high-grade stenosis at the origin of the bilateral common iliac arteries right greater than left. Diffuse atherosclerosis throughout the external iliac, common femoral, and visualized portion of the superficial femoral distributions. Veins: No obvious venous abnormality within the limitations of this arterial phase study. Review of the MIP images confirms the above findings. NON-VASCULAR Hepatobiliary: Hepatic steatosis. No focal parenchymal abnormality. The gallbladder is unremarkable. Pancreas: Unremarkable. No pancreatic ductal dilatation or surrounding inflammatory changes. Spleen: Normal in size without focal abnormality. Adrenals/Urinary Tract: Adrenal glands are unremarkable. Kidneys are normal, without renal calculi, focal lesion, or hydronephrosis. Bladder is unremarkable. Stomach/Bowel: No bowel obstruction or ileus. No bowel wall thickening or inflammatory change. Lymphatic: No pathologic adenopathy within the abdomen or pelvis. Reproductive: Prostate is unremarkable. Other: Large retroperitoneal hematoma and active arterial hemorrhage from ruptured aneurysm as above. There is no free intraperitoneal fluid or free intraperitoneal gas. No abdominal wall hernia. Musculoskeletal: No acute or destructive bony lesions. Postsurgical changes from L4-5 and L5-S1  discectomy and posterior fusion. Reconstructed images demonstrate no additional findings. Review of the MIP images confirms the above findings. IMPRESSION: 1. Ruptured infrarenal abdominal aortic aneurysm as above, measuring approximately 7.4 x 6.7 cm in transverse dimension and extending 12.4 cm in craniocaudal length. Large retroperitoneal hematoma and extravasation of arterial contrast consistent with active retroperitoneal hemorrhage. Emergent vascular surgery consultation recommended. 2. Hepatic steatosis. 3.  Aortic Atherosclerosis (ICD10-I70.0). Critical Value/emergent results were called by telephone at the time of interpretation on 01-11-2022 at 3:26 pm to provider [facility] , who verbally acknowledged these results. Electronically Signed   By: Sharlet Salina [name]   On: 2022/01/11 15:40    Review of Systems  Gastrointestinal:  Positive for abdominal distention and abdominal pain.   Blood pressure (!) 100/23, pulse (!) 113, temperature 97.8 F (36.6 C), temperature source Oral, resp. rate (!) 21, height  (1.753 m), weight 122.5 kg, SpO2 (!) 89 %. Physical Exam Vitals and nursing note reviewed.  Constitutional:      Appearance: He is ill-appearing and diaphoretic.  Cardiovascular:     Rate and Rhythm: Tachycardia present.     Comments: Bilateral palpable femoral pulse Pulmonary:     Comments: Intubated Abdominal:     General: There is distension.     Palpations: There is pulsatile mass.  Skin:    General: Skin is warm.  Assessment/Plan: Ruptured Infrarenal 7.6 cm AAA Plan for Emergent EVAR; Verbal consent obtained from son.  After patient intubated, went into cardiac arrest. CPR began and continued for >34minutes. Discussed patients condition again with son. I explained that he would not likely survive this catastrophic event. He expressed that he did not want further medications or compressions to keep his fathers heart going, nor did he want surgical interventions at  this point. The Intensivist- Dr. Karna Christmas present during conversation. Will make DNR with no surgical intervention.    Scott Riddle A 01-31-22, 5:14 PM

## 2022-01-26 NOTE — Progress Notes (Signed)
Chaplain responded to page for ED. Nurse shared update on pt, and shared son was present and upset. Chaplain offered compassionate presence and introduced services. Asked that thi chaplain come back later this evening. Please call chaplain if son changes mind or needs.

## 2022-01-26 NOTE — Code Documentation (Signed)
6th epi given, PRBC units 6,7& 8, emergency release, (MTP) hung/ infusing.

## 2022-01-26 NOTE — ED Notes (Addendum)
Back from CT, into room. Restless, writhing. States, can't breathe, NRP 15L placed, Dr. Fuller Plan into room, emergency blood present. 2nd RN at [facility]. attempting to establish large bore 16g/14g in R arm.

## 2022-01-26 NOTE — Procedures (Signed)
Endotracheal Intubation: Patient required placement of an artificial airway secondary to acute hypoxic respiratory failure    Consent: Emergent.    Hand washing performed prior to starting the procedure.    Medications administered for sedation prior to procedure:  Fentanyl 100 mcg IV, 20 mg Etomidate IV, Rocuronium 100 mg IV     A time out procedure was called and correct patient, name, & ID confirmed. Needed supplies and equipment were assembled and checked to include ETT, 10 ml syringe, Glidescope, Mac and Miller blades, suction, oxygen and bag mask valve, end tidal CO2 monitor.    Patient was positioned to align the mouth and pharynx to facilitate visualization of the glottis.    Heart rate, SpO2 and blood pressure was continuously monitored during the procedure. Pre-oxygenation was conducted prior to intubation and endotracheal tube was placed through the vocal cords into the trachea.       The artificial airway was placed under direct visualization via glidescope route using a 8 cm ETT on the first attempt.   ETT was secured at 26 cm .   Placement was confirmed by auscuitation of lungs with good breath sounds bilaterally and no stomach sounds.  Condensation was noted on endotracheal tube.   Pulse ox 98% CO2 detector in place with appropriate color change.    Complications: None .        Chest radiograph ordered and pending.   Donell Beers, [place] Pager [phone] (please enter 7 digits) PCCM Consult Pager [phone] (please enter 7 digits)

## 2022-01-26 NOTE — Progress Notes (Signed)
Called ED for report on patient, RN to return call back to this nurse.

## 2022-01-26 NOTE — Code Documentation (Signed)
PRBC units continue, HR 127, pulse strong and bounding.

## 2022-01-26 NOTE — Procedures (Signed)
Central Venous Catheter Placement:TRIPLE LUMEN Indication: Patient receiving vesicant or irritant drug.; Patient receiving intravenous therapy for longer than 5 days.; Patient has limited or no vascular access.   Consent:emergent    Hand washing performed prior to starting the procedure.   Procedure:   Line placed EMERGENTLY  during ACLS while patient in PEA arrest Patient was positioned correctly for central venous access.  Patient was prepped using strict sterile technique including chlorohexadine preps, sterile drape, sterile gown and sterile gloves.    The area was prepped, draped and anesthetized in the usual sterile manner. Patient comfort was obtained.    A triple lumen catheter was placed in Left Femoral vein. There was good blood return, catheter caps were placed on lumens, catheter flushed easily, the line was secured and a sterile dressing and BIO-PATCH applied.   Ultrasound was used to visualize vasculature and guidance of needle.   Number of Attempts: 1 Complications:none Estimated Blood Loss: none Chest Radiograph indicated and ordered.     Vida Rigger, [name]  Pulmonary & Critical Care Medicine  [facility]

## 2022-01-26 NOTE — H&P (Signed)
NAME:  Scott Riddle, MRN:  [id number], DOB:  1950-09-19, LOS: 0 ADMISSION DATE:  2022-01-18, CONSULTATION DATE: 18-Jan-2022 REFERRING MD: Dr. Fuller Plan, CHIEF COMPLAINT: Back Pain    History of Present Illness:  This is a 71 yo male who presented to [facility] ER on 01-18-2022 via EMS with c/o right lower back and abdominal pain onset of symptoms 01-18-22.  Pt also endorsed a 3 day hx of constipation.  Per EMS run sheet the pt reported he took miralax and senna without relief of symptoms.  He has a hx of 3 yr hx of AAA measuring 5.5 cm which is being followed by vascular surgery at the [place] with last check 6 months ago no surgical interventions recommended at that time.   ED Course  Upon arrival to the ER pt c/o of acute abdominal pain and initially hypotensive during triage, but became normotensive.  CTA Chest/Abd/Pel for Dissection revealed ruptured infrarenal abdominal aortic aneurysm with large retroperitoneal hematoma and active retroperitoneal hemorrhage.  Vascular Surgeon Dr. [name] contacted by ER physician who did not recommend TXA, but recommended permissive hypotension.  Dr. [name] to proceed to the ER to evaluate pt.  Pt also received 2 units of pRBC's.  PCCM team contacted by ER provider for ICU admission.  Pt severely encephalopathic and in acute respiratory failure requiring mechanical intubation by PCCM team in the ER.  Following mechanical intubation pt PEA arrested.  ACLS protocol initiated and Samuel Bouche applied.  Massive blood transfusion protocol initiated.  ROSC achieved following 33 minutes of ACLS.  Pts son informed by Vascular Surgeon Dr. [name] unable to proceed with surgical procedure at this time due to unstable condition and prognosis grave.  Pts son changed code status to DNR with no escalation of care.     CTA Chest/Abd/Pelvis for Dissection: Ruptured infrarenal abdominal aortic aneurysm as above, measuring approximately 7.4 x 6.7 cm in transverse dimension and extending 12.4 cm in craniocaudal  length. Large retroperitoneal hematoma and extravasation of arterial contrast consistent with active retroperitoneal hemorrhage. Emergent vascular surgery consultation recommended. Hepatic steatosis. Aortic Atherosclerosis (ICD10-I70.0).  Immediately upon arrival to ICU pt asystole on cardiac monitor and pulseless.  Pt expired at 17:34 on 01/18/22.  Pts son at bedside   Pertinent  Medical History  AAA CHF  Type II Diabetes Mellitus    Interim History / Subjective:  As outlined above   Objective   Blood pressure (!) 68/12, pulse (!) 113, temperature 97.8 F (36.6 C), temperature source Oral, resp. rate (!) 22, height 5\' 9"  (1.753 m), weight 122.5 kg, SpO2 (!) 89 %.        Intake/Output Summary (Last 24 hours) at 18-Jan-2022 1706 Last data filed at January 18, 2022 1553 Gross per 24 hour  Intake 2770 ml  Output --  Net 2770 ml   Filed Weights   01/18/22 1425  Weight: 122.5 kg    Labs   CBC: Recent Labs  Lab 18-Jan-2022 1431  WBC 14.1*  HGB 12.3*  HCT 36.4*  MCV 85.2  PLT 267    Basic Metabolic Panel: Recent Labs  Lab 01/18/22 1431  NA 134*  K 4.7  CL 100  CO2 20*  GLUCOSE 223*  BUN 22  CREATININE 1.75*  CALCIUM 9.3   GFR: Estimated Creatinine Clearance: 52.2 mL/min (A) (by C-G formula based on SCr of 1.75 mg/dL (H)). Recent Labs  Lab 18-Jan-2022 1431  WBC 14.1*    Liver Function Tests: Recent Labs  Lab 2022-01-18 1431  AST 27  ALT  13  ALKPHOS 70  BILITOT 1.6*  PROT 7.2  ALBUMIN 3.6   Recent Labs  Lab February 02, 2022 1431  LIPASE 31   No results for input(s): "AMMONIA" in the last 168 hours.  ABG No results found for: "PHART", "PCO2ART", "PO2ART", "HCO3", "TCO2", "ACIDBASEDEF", "O2SAT"   Coagulation Profile: No results for input(s): "INR", "PROTIME" in the last 168 hours.  Cardiac Enzymes: No results for input(s): "CKTOTAL", "CKMB", "CKMBINDEX", "TROPONINI" in the last 168 hours.  HbA1C: Hemoglobin A1C  Date/Time Value Ref Range Status   06/30/2011 08:26 AM 6.2 4.2 - 6.3 % Final    Comment:    The American Diabetes Association recommends that a primary goal of therapy should be <7% and that physicians should reevaluate the treatment regimen in patients with HbA1c values consistently >8%.     CBG: No results for input(s): "GLUCAP" in the last 168 hours.  Review of Systems:   N/A  Past Medical History:  He,  has a past medical history of Aortic arch aneurysm (HCC) (07/24/2014), CHF (congestive heart failure) (HCC), and Diabetes mellitus without complication (HCC).   Surgical History:  History reviewed. No pertinent surgical history.   Social History:   reports that he has been smoking cigarettes. He has been smoking an average of 1 pack per day. He has never used smokeless tobacco. He reports that he does not drink alcohol and does not use drugs.   Family History:  His family history is not on file.   Allergies Allergies  Allergen Reactions   Neurontin [Gabapentin]    Plavix [Clopidogrel Bisulfate]    Duloxetine Anxiety     Home Medications  Prior to Admission medications   Medication Sig Start Date End Date Taking? Authorizing Provider  amLODipine (NORVASC) 5 MG tablet Take by mouth.    [provider]  atorvastatin (LIPITOR) 80 MG tablet Take by mouth.    [provider]  ergocalciferol (VITAMIN D2) 1.25 MG (50000 UT) capsule Take by mouth.    [provider]  lisinopril (ZESTRIL) 40 MG tablet Take by mouth.    [provider]  magnesium oxide (MAG-OX) 400 MG tablet Take by mouth.    [provider]  meclizine (ANTIVERT) 25 MG tablet Take 1 tablet (25 mg total) by mouth 3 (three) times daily as needed for dizziness. 01/07/18   Arnaldo Natal, MD  metFORMIN (GLUCOPHAGE) 500 MG tablet Take 500 mg by mouth daily with breakfast.    [provider]  methadone (DOLOPHINE) 10 MG tablet Take by mouth.    [provider]  morphine (ROXANOL) 20 MG/ML  concentrated solution Take by mouth.    [provider]  senna (SENOKOT) 8.6 MG tablet Take 1 tablet by mouth daily.    [provider]     Critical care time: 80 minutes      Zada Girt, AGNP  Pulmonary/Critical Care Pager [phone] (please enter 7 digits) PCCM Consult Pager [phone] (please enter 7 digits)

## 2022-01-26 NOTE — ED Notes (Signed)
In to CT, on monitor with RN, IVF bolus infusing.

## 2022-01-26 NOTE — Sedation Documentation (Signed)
Diaphoretic. Repositioned, returned to monitor

## 2022-01-26 NOTE — Code Documentation (Signed)
Returned to vent

## 2022-01-26 NOTE — Sedation Documentation (Signed)
LUCAS applied

## 2022-01-26 NOTE — ED Notes (Signed)
Son in ICU w/r. Pt and care transferred to ICU team. Transferred from stretcher to bed. PCCM NP and MD present.

## 2022-01-26 NOTE — Code Documentation (Signed)
Pulseless, PEA

## 2022-01-26 NOTE — Code Documentation (Signed)
82/11 (26), HR119, pulses present, pending surgeon arrival

## 2022-01-26 NOTE — ED Notes (Signed)
Enroute to ICU HR 26-33.

## 2022-01-26 NOTE — ED Notes (Signed)
Advised nurse that patient has ready bed 

## 2022-01-26 NOTE — ED Notes (Signed)
2nd unit PRBC in to R AC.

## 2022-01-26 NOTE — ED Notes (Signed)
PCCM and surgeon at [facility]. ROSC continues. No SPO2. No BP. HR 116, trending down 97. RR 21 on vent.

## 2022-01-26 NOTE — Code Documentation (Signed)
CPR started, PCCM present, off the vent, bagging.

## 2022-01-26 NOTE — ED Triage Notes (Addendum)
Pt reports thought he was constipated so he took some miralax to help. Pt reports no relief so he took more today and when he went to the bathroom and pushed down he thinks he ruptured a disc or something. Pt is very pale and diaphoretic in triage. Pt not able to get comfortable. Pt states pain is now localized to his RLQ. Pt also reports has [facility] the past 6 years.  Pt stating he has no strength. Pt also stating "I am going to die"

## 2022-01-26 NOTE — Code Documentation (Signed)
Zoll rhythm strip recorded. HR 124, pulse present.

## 2022-01-26 NOTE — ED Notes (Signed)
HR 40. Dopplered, pulse present, HR pulse correlate at 40. Son at [facility]. Transfer to ICU plan discussed with son at [facility]. Discussed with Dr. Vicente Males. To ICU with pt and son, pt on monitor and vent, with RT. Blood continues to transfuse.

## 2022-01-26 NOTE — ED Notes (Addendum)
Dr. Fuller Plan at [facility]. 1st unit of blood in to L AC.

## 2022-01-26 NOTE — Code Documentation (Signed)
Dopplered, and pulse check, pulseless, asystole, CPR continues with LUCAS

## 2022-01-26 DEATH — deceased
